# Patient Record
Sex: Female | Born: 1970 | Race: White | Hispanic: No | Marital: Married | Smoking: Never smoker
Health system: Southern US, Community
[De-identification: ages and names within clinical notes are randomized; demographics above are authoritative.]

## PROBLEM LIST (undated history)

## (undated) DIAGNOSIS — E039 Hypothyroidism, unspecified: Secondary | ICD-10-CM

## (undated) DIAGNOSIS — T4145XA Adverse effect of unspecified anesthetic, initial encounter: Secondary | ICD-10-CM

## (undated) DIAGNOSIS — Z9889 Other specified postprocedural states: Secondary | ICD-10-CM

## (undated) DIAGNOSIS — K635 Polyp of colon: Secondary | ICD-10-CM

## (undated) DIAGNOSIS — R112 Nausea with vomiting, unspecified: Secondary | ICD-10-CM

## (undated) DIAGNOSIS — D649 Anemia, unspecified: Secondary | ICD-10-CM

## (undated) DIAGNOSIS — J309 Allergic rhinitis, unspecified: Secondary | ICD-10-CM

## (undated) DIAGNOSIS — I499 Cardiac arrhythmia, unspecified: Secondary | ICD-10-CM

## (undated) DIAGNOSIS — C801 Malignant (primary) neoplasm, unspecified: Secondary | ICD-10-CM

## (undated) DIAGNOSIS — R519 Headache, unspecified: Secondary | ICD-10-CM

## (undated) HISTORY — PX: DILATION AND CURETTAGE OF UTERUS: SHX78

## (undated) HISTORY — PX: CHOLECYSTECTOMY: SHX55

## (undated) HISTORY — DX: Polyp of colon: K63.5

---
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
## 1996-02-27 HISTORY — PX: DEEP NECK LYMPH NODE BIOPSY / EXCISION: SUR126

## 1996-02-27 HISTORY — PX: LYMPH NODE BIOPSY: SHX201

## 2007-05-14 ENCOUNTER — Encounter: Payer: Self-pay | Admitting: Family Medicine

## 2007-10-22 ENCOUNTER — Encounter (INDEPENDENT_AMBULATORY_CARE_PROVIDER_SITE_OTHER): Payer: Self-pay | Admitting: *Deleted

## 2007-11-29 ENCOUNTER — Emergency Department (HOSPITAL_BASED_OUTPATIENT_CLINIC_OR_DEPARTMENT_OTHER): Admission: EM | Admit: 2007-11-29 | Discharge: 2007-11-29 | Payer: Self-pay | Admitting: Emergency Medicine

## 2007-12-08 ENCOUNTER — Other Ambulatory Visit: Admission: RE | Admit: 2007-12-08 | Discharge: 2007-12-08 | Payer: Self-pay | Admitting: Family Medicine

## 2007-12-08 ENCOUNTER — Ambulatory Visit: Payer: Self-pay | Admitting: Family Medicine

## 2007-12-08 ENCOUNTER — Encounter: Payer: Self-pay | Admitting: Family Medicine

## 2007-12-08 DIAGNOSIS — R002 Palpitations: Secondary | ICD-10-CM

## 2007-12-08 DIAGNOSIS — E049 Nontoxic goiter, unspecified: Secondary | ICD-10-CM | POA: Insufficient documentation

## 2007-12-08 DIAGNOSIS — E039 Hypothyroidism, unspecified: Secondary | ICD-10-CM | POA: Insufficient documentation

## 2007-12-11 ENCOUNTER — Encounter (INDEPENDENT_AMBULATORY_CARE_PROVIDER_SITE_OTHER): Payer: Self-pay | Admitting: *Deleted

## 2007-12-16 ENCOUNTER — Ambulatory Visit: Payer: Self-pay | Admitting: Family Medicine

## 2007-12-23 ENCOUNTER — Encounter (INDEPENDENT_AMBULATORY_CARE_PROVIDER_SITE_OTHER): Payer: Self-pay | Admitting: *Deleted

## 2007-12-23 LAB — CONVERTED CEMR LAB
ALT: 17 units/L (ref 0–35)
AST: 16 units/L (ref 0–37)
Albumin: 4.1 g/dL (ref 3.5–5.2)
Alkaline Phosphatase: 32 units/L — ABNORMAL LOW (ref 39–117)
BUN: 12 mg/dL (ref 6–23)
Basophils Absolute: 0 10*3/uL (ref 0.0–0.1)
Basophils Relative: 0 % (ref 0.0–3.0)
Bilirubin, Direct: 0.1 mg/dL (ref 0.0–0.3)
CO2: 30 meq/L (ref 19–32)
Calcium: 9 mg/dL (ref 8.4–10.5)
Chloride: 104 meq/L (ref 96–112)
Cholesterol: 141 mg/dL (ref 0–200)
Creatinine, Ser: 0.6 mg/dL (ref 0.4–1.2)
Eosinophils Absolute: 0.1 10*3/uL (ref 0.0–0.7)
Eosinophils Relative: 2.3 % (ref 0.0–5.0)
Free T4: 0.9 ng/dL (ref 0.6–1.6)
GFR calc Af Amer: 145 mL/min
GFR calc non Af Amer: 120 mL/min
Glucose, Bld: 82 mg/dL (ref 70–99)
HCT: 39.6 % (ref 36.0–46.0)
HDL: 52 mg/dL (ref >39.0)
Hemoglobin: 13.7 g/dL (ref 12.0–15.0)
LDL Cholesterol: 73 mg/dL (ref 0–99)
Lymphocytes Relative: 27.8 % (ref 12.0–46.0)
MCHC: 34.6 g/dL (ref 30.0–36.0)
MCV: 92.6 fL (ref 78.0–100.0)
Monocytes Absolute: 0.4 10*3/uL (ref 0.1–1.0)
Monocytes Relative: 7.7 % (ref 3.0–12.0)
Neutro Abs: 3.4 10*3/uL (ref 1.4–7.7)
Neutrophils Relative %: 62.2 % (ref 43.0–77.0)
Platelets: 193 10*3/uL (ref 150–400)
Potassium: 4.1 meq/L (ref 3.5–5.1)
RBC: 4.28 M/uL (ref 3.87–5.11)
RDW: 11.8 % (ref 11.5–14.6)
Sodium: 138 meq/L (ref 135–145)
T3, Free: 2.8 pg/mL (ref 2.3–4.2)
TSH: 1.06 microintl units/mL (ref 0.35–5.50)
Total Bilirubin: 1 mg/dL (ref 0.3–1.2)
Total CHOL/HDL Ratio: 2.7
Total Protein: 7.2 g/dL (ref 6.0–8.3)
Triglycerides: 82 mg/dL (ref 0–149)
VLDL: 16 mg/dL (ref 0–40)
WBC: 5.4 10*3/uL (ref 4.5–10.5)

## 2008-01-21 ENCOUNTER — Ambulatory Visit: Payer: Self-pay | Admitting: Family Medicine

## 2008-01-21 DIAGNOSIS — N898 Other specified noninflammatory disorders of vagina: Secondary | ICD-10-CM | POA: Insufficient documentation

## 2008-01-23 ENCOUNTER — Encounter (INDEPENDENT_AMBULATORY_CARE_PROVIDER_SITE_OTHER): Payer: Self-pay | Admitting: *Deleted

## 2008-02-27 DIAGNOSIS — T8859XA Other complications of anesthesia, initial encounter: Secondary | ICD-10-CM

## 2008-02-27 HISTORY — DX: Other complications of anesthesia, initial encounter: T88.59XA

## 2008-02-27 HISTORY — PX: CHOLECYSTECTOMY: SHX55

## 2008-06-08 ENCOUNTER — Encounter: Payer: Self-pay | Admitting: Family Medicine

## 2009-12-21 ENCOUNTER — Encounter: Admission: RE | Admit: 2009-12-21 | Discharge: 2009-12-21 | Payer: Self-pay | Admitting: Obstetrics

## 2010-11-28 LAB — BASIC METABOLIC PANEL
CO2: 29
Calcium: 9.3
Chloride: 103
Glucose, Bld: 98
Sodium: 140

## 2010-11-28 LAB — CBC
Hemoglobin: 13
MCHC: 33.8
MCV: 91.6
RBC: 4.19
RDW: 11.7

## 2010-11-28 LAB — DIFFERENTIAL
Basophils Absolute: 0.1
Basophils Relative: 1
Eosinophils Absolute: 0.2
Eosinophils Relative: 3
Monocytes Absolute: 0.6
Monocytes Relative: 8
Neutro Abs: 4

## 2011-01-10 ENCOUNTER — Other Ambulatory Visit: Payer: Self-pay | Admitting: Obstetrics

## 2011-01-10 DIAGNOSIS — Z1231 Encounter for screening mammogram for malignant neoplasm of breast: Secondary | ICD-10-CM

## 2011-02-16 ENCOUNTER — Ambulatory Visit
Admission: RE | Admit: 2011-02-16 | Discharge: 2011-02-16 | Disposition: A | Discharge status: Home / Self Care | Payer: Managed Care, Other (non HMO) | Source: Ambulatory Visit | Attending: Obstetrics | Admitting: Obstetrics

## 2011-02-16 DIAGNOSIS — Z1231 Encounter for screening mammogram for malignant neoplasm of breast: Secondary | ICD-10-CM

## 2012-01-28 ENCOUNTER — Other Ambulatory Visit: Payer: Self-pay | Admitting: Obstetrics

## 2012-01-28 DIAGNOSIS — Z1231 Encounter for screening mammogram for malignant neoplasm of breast: Secondary | ICD-10-CM

## 2012-02-25 ENCOUNTER — Ambulatory Visit
Admission: RE | Admit: 2012-02-25 | Discharge: 2012-02-25 | Disposition: A | Discharge status: Home / Self Care | Payer: Managed Care, Other (non HMO) | Source: Ambulatory Visit | Attending: Obstetrics | Admitting: Obstetrics

## 2012-02-25 DIAGNOSIS — Z1231 Encounter for screening mammogram for malignant neoplasm of breast: Secondary | ICD-10-CM

## 2012-03-03 ENCOUNTER — Other Ambulatory Visit: Payer: Self-pay | Admitting: Obstetrics

## 2012-03-03 DIAGNOSIS — R928 Other abnormal and inconclusive findings on diagnostic imaging of breast: Secondary | ICD-10-CM

## 2012-03-07 ENCOUNTER — Ambulatory Visit
Admission: RE | Admit: 2012-03-07 | Discharge: 2012-03-07 | Disposition: A | Discharge status: Home / Self Care | Payer: BC Managed Care – PPO | Source: Ambulatory Visit | Attending: Obstetrics | Admitting: Obstetrics

## 2012-03-07 DIAGNOSIS — R928 Other abnormal and inconclusive findings on diagnostic imaging of breast: Secondary | ICD-10-CM

## 2013-01-27 ENCOUNTER — Other Ambulatory Visit: Payer: Self-pay

## 2013-01-27 DIAGNOSIS — Z1231 Encounter for screening mammogram for malignant neoplasm of breast: Secondary | ICD-10-CM

## 2013-02-26 HISTORY — PX: COLONOSCOPY: SHX174

## 2013-03-04 ENCOUNTER — Ambulatory Visit
Admission: RE | Admit: 2013-03-04 | Discharge: 2013-03-04 | Disposition: A | Discharge status: Home / Self Care | Payer: BC Managed Care – HMO | Source: Ambulatory Visit

## 2013-03-04 DIAGNOSIS — Z1231 Encounter for screening mammogram for malignant neoplasm of breast: Secondary | ICD-10-CM

## 2013-07-03 ENCOUNTER — Other Ambulatory Visit: Payer: Self-pay | Admitting: Gastroenterology

## 2013-07-03 DIAGNOSIS — R1310 Dysphagia, unspecified: Secondary | ICD-10-CM

## 2013-07-06 ENCOUNTER — Encounter (HOSPITAL_COMMUNITY): Payer: Self-pay | Admitting: *Deleted

## 2013-07-06 ENCOUNTER — Encounter (HOSPITAL_COMMUNITY): Payer: Self-pay | Admitting: Pharmacy Technician

## 2013-07-09 ENCOUNTER — Ambulatory Visit
Admission: RE | Admit: 2013-07-09 | Discharge: 2013-07-09 | Disposition: A | Discharge status: Home / Self Care | Payer: BC Managed Care – HMO | Source: Ambulatory Visit | Attending: Gastroenterology | Admitting: Gastroenterology

## 2013-07-09 ENCOUNTER — Encounter (INDEPENDENT_AMBULATORY_CARE_PROVIDER_SITE_OTHER): Payer: Self-pay

## 2013-07-09 DIAGNOSIS — R1310 Dysphagia, unspecified: Secondary | ICD-10-CM

## 2013-07-13 ENCOUNTER — Other Ambulatory Visit: Payer: Self-pay | Admitting: Gastroenterology

## 2013-07-13 ENCOUNTER — Encounter (HOSPITAL_COMMUNITY): Payer: BC Managed Care – PPO | Admitting: Anesthesiology

## 2013-07-13 ENCOUNTER — Encounter (HOSPITAL_COMMUNITY)
Admission: RE | Disposition: A | Discharge status: Home / Self Care | Payer: Self-pay | Source: Ambulatory Visit | Attending: Gastroenterology

## 2013-07-13 ENCOUNTER — Ambulatory Visit (HOSPITAL_COMMUNITY)
Admission: RE | Admit: 2013-07-13 | Discharge: 2013-07-13 | Disposition: A | Discharge status: Home / Self Care | Payer: BC Managed Care – PPO | Source: Ambulatory Visit | Attending: Gastroenterology | Admitting: Gastroenterology

## 2013-07-13 ENCOUNTER — Ambulatory Visit (HOSPITAL_COMMUNITY): Payer: BC Managed Care – PPO | Admitting: Anesthesiology

## 2013-07-13 ENCOUNTER — Encounter (HOSPITAL_COMMUNITY): Payer: Self-pay

## 2013-07-13 DIAGNOSIS — E039 Hypothyroidism, unspecified: Secondary | ICD-10-CM | POA: Insufficient documentation

## 2013-07-13 DIAGNOSIS — R079 Chest pain, unspecified: Secondary | ICD-10-CM | POA: Insufficient documentation

## 2013-07-13 DIAGNOSIS — R1310 Dysphagia, unspecified: Secondary | ICD-10-CM | POA: Insufficient documentation

## 2013-07-13 HISTORY — DX: Adverse effect of unspecified anesthetic, initial encounter: T41.45XA

## 2013-07-13 HISTORY — DX: Hypothyroidism, unspecified: E03.9

## 2013-07-13 HISTORY — PX: ESOPHAGEAL MANOMETRY: SHX5429

## 2013-07-13 HISTORY — DX: Cardiac arrhythmia, unspecified: I49.9

## 2013-07-13 SURGERY — EGD (ESOPHAGOGASTRODUODENOSCOPY)
Anesthesia: Monitor Anesthesia Care

## 2013-07-13 SURGERY — MANOMETRY, ESOPHAGUS

## 2013-07-13 MED ORDER — PROPOFOL 10 MG/ML IV BOLUS
INTRAVENOUS | Status: AC
  Filled 2013-07-13: qty 20

## 2013-07-13 MED ORDER — SODIUM CHLORIDE 0.9 % IV SOLN: INTRAVENOUS | Status: DC

## 2013-07-13 MED ORDER — LIDOCAINE VISCOUS 2 % MT SOLN
OROMUCOSAL | Status: AC
  Filled 2013-07-13: qty 15

## 2013-07-13 MED ORDER — LACTATED RINGERS IV SOLN
INTRAVENOUS | Status: DC
  Administered 2013-07-13: 11:00:00 via INTRAVENOUS

## 2013-07-13 SURGICAL SUPPLY — 1 items: FACESHIELD LNG OPTICON STERILE (SAFETY) IMPLANT

## 2013-07-13 NOTE — Addendum Note (Signed)
Addended by: Danne Scardina on: 07/13/2013 09:02 AM   Modules accepted: Orders  

## 2013-07-13 NOTE — Anesthesia Preprocedure Evaluation (Deleted)
Anesthesia Evaluation  Patient identified by MRN, date of birth, ID band Patient awake    Reviewed: Allergy & Precautions, H&P , NPO status , Patient's Chart, lab work & pertinent test results  Airway Mallampati: II TM Distance: >3 FB Neck ROM: Full    Dental no notable dental hx.    Pulmonary neg pulmonary ROS,  breath sounds clear to auscultation  Pulmonary exam normal       Cardiovascular negative cardio ROS  Rhythm:Regular Rate:Normal     Neuro/Psych negative neurological ROS  negative psych ROS   GI/Hepatic negative GI ROS, Neg liver ROS,   Endo/Other  Hypothyroidism   Renal/GU negative Renal ROS  negative genitourinary   Musculoskeletal negative musculoskeletal ROS (+)   Abdominal   Peds negative pediatric ROS (+)  Hematology negative hematology ROS (+)   Anesthesia Other Findings   Reproductive/Obstetrics negative OB ROS                           Anesthesia Physical Anesthesia Plan  ASA: II  Anesthesia Plan: MAC   Post-op Pain Management:    Induction: Intravenous  Airway Management Planned: Nasal Cannula  Additional Equipment:   Intra-op Plan:   Post-operative Plan:   Informed Consent: I have reviewed the patients History and Physical, chart, labs and discussed the procedure including the risks, benefits and alternatives for the proposed anesthesia with the patient or authorized representative who has indicated his/her understanding and acceptance.     Plan Discussed with: CRNA and Surgeon  Anesthesia Plan Comments:         Anesthesia Quick Evaluation

## 2013-07-13 NOTE — OR Nursing (Addendum)
Bravo calibration solution expired.  Notified MD and explained to patient.  MD office will contact patient to reschedule. Egd and Bravo cancelled for 5/1//15.

## 2013-07-14 ENCOUNTER — Encounter (HOSPITAL_COMMUNITY): Payer: Self-pay | Admitting: Gastroenterology

## 2013-07-16 ENCOUNTER — Encounter (HOSPITAL_COMMUNITY): Payer: Self-pay | Admitting: *Deleted

## 2013-07-27 ENCOUNTER — Other Ambulatory Visit: Payer: Self-pay | Admitting: Gastroenterology

## 2013-07-27 NOTE — Addendum Note (Signed)
Addended by: Arta Silence on: 07/27/2013 02:29 PM   Modules accepted: Orders

## 2013-07-29 ENCOUNTER — Encounter (HOSPITAL_COMMUNITY): Payer: BC Managed Care – PPO | Admitting: Anesthesiology

## 2013-07-29 ENCOUNTER — Encounter (HOSPITAL_COMMUNITY)
Admission: RE | Disposition: A | Discharge status: Home / Self Care | Payer: Self-pay | Source: Ambulatory Visit | Attending: Gastroenterology

## 2013-07-29 ENCOUNTER — Ambulatory Visit (HOSPITAL_COMMUNITY): Payer: BC Managed Care – PPO | Admitting: Anesthesiology

## 2013-07-29 ENCOUNTER — Encounter (HOSPITAL_COMMUNITY): Payer: Self-pay

## 2013-07-29 ENCOUNTER — Ambulatory Visit (HOSPITAL_COMMUNITY)
Admission: RE | Admit: 2013-07-29 | Discharge: 2013-07-29 | Disposition: A | Discharge status: Home / Self Care | Payer: BC Managed Care – PPO | Source: Ambulatory Visit | Attending: Gastroenterology | Admitting: Gastroenterology

## 2013-07-29 DIAGNOSIS — E039 Hypothyroidism, unspecified: Secondary | ICD-10-CM | POA: Insufficient documentation

## 2013-07-29 DIAGNOSIS — R1310 Dysphagia, unspecified: Secondary | ICD-10-CM | POA: Insufficient documentation

## 2013-07-29 DIAGNOSIS — R079 Chest pain, unspecified: Secondary | ICD-10-CM | POA: Insufficient documentation

## 2013-07-29 HISTORY — PX: BRAVO PH STUDY: SHX5421

## 2013-07-29 HISTORY — PX: ESOPHAGOGASTRODUODENOSCOPY (EGD) WITH PROPOFOL: SHX5813

## 2013-07-29 SURGERY — ESOPHAGOGASTRODUODENOSCOPY (EGD) WITH PROPOFOL
Anesthesia: Monitor Anesthesia Care

## 2013-07-29 MED ORDER — LACTATED RINGERS IV SOLN
INTRAVENOUS | Status: DC | PRN
  Administered 2013-07-29: 09:00:00 via INTRAVENOUS

## 2013-07-29 MED ORDER — EPHEDRINE SULFATE 50 MG/ML IJ SOLN
INTRAMUSCULAR | Status: AC
  Filled 2013-07-29: qty 1

## 2013-07-29 MED ORDER — ONDANSETRON HCL 4 MG/2ML IJ SOLN
INTRAMUSCULAR | Status: AC
  Filled 2013-07-29: qty 2

## 2013-07-29 MED ORDER — KETAMINE HCL 10 MG/ML IJ SOLN
INTRAMUSCULAR | Status: AC
  Filled 2013-07-29: qty 1

## 2013-07-29 MED ORDER — PROPOFOL 10 MG/ML IV BOLUS
INTRAVENOUS | Status: AC
  Filled 2013-07-29: qty 20

## 2013-07-29 MED ORDER — LACTATED RINGERS IV SOLN
INTRAVENOUS | Status: DC
Start: 2013-07-29 — End: 2013-07-29
  Administered 2013-07-29: 09:00:00 via INTRAVENOUS

## 2013-07-29 MED ORDER — LIDOCAINE HCL (CARDIAC) 20 MG/ML IV SOLN
INTRAVENOUS | Status: AC
  Filled 2013-07-29: qty 5

## 2013-07-29 MED ORDER — SODIUM CHLORIDE 0.9 % IV SOLN: INTRAVENOUS | Status: DC

## 2013-07-29 MED ORDER — PROPOFOL INFUSION 10 MG/ML OPTIME
INTRAVENOUS | Status: DC | PRN
  Administered 2013-07-29: 140 ug/kg/min via INTRAVENOUS

## 2013-07-29 MED ORDER — SODIUM CHLORIDE 0.9 % IJ SOLN
INTRAMUSCULAR | Status: AC
  Filled 2013-07-29: qty 10

## 2013-07-29 MED ORDER — PROPOFOL 10 MG/ML IV EMUL
INTRAVENOUS | Status: DC | PRN
  Administered 2013-07-29: 40 mg via INTRAVENOUS

## 2013-07-29 MED ORDER — ONDANSETRON HCL 4 MG/2ML IJ SOLN
INTRAMUSCULAR | Status: DC | PRN
  Administered 2013-07-29: 4 mg via INTRAVENOUS

## 2013-07-29 MED ORDER — BUTAMBEN-TETRACAINE-BENZOCAINE 2-2-14 % EX AERO
INHALATION_SPRAY | CUTANEOUS | Status: DC | PRN
  Administered 2013-07-29: 1 via TOPICAL

## 2013-07-29 MED ORDER — KETAMINE HCL 10 MG/ML IJ SOLN
INTRAMUSCULAR | Status: DC | PRN
  Administered 2013-07-29: 20 mg via INTRAVENOUS

## 2013-07-29 SURGICAL SUPPLY — 14 items

## 2013-07-29 NOTE — Anesthesia Postprocedure Evaluation (Signed)
Anesthesia Post Note  Patient: Dana Joseph  Procedure(s) Performed: Procedure(s) (LRB): ESOPHAGOGASTRODUODENOSCOPY (EGD) WITH PROPOFOL (N/A) BRAVO PH STUDY (N/A)  Anesthesia type: MAC  Patient location: PACU  Post pain: Pain level controlled  Post assessment: Post-op Vital signs reviewed  Last Vitals: BP 104/62  Pulse 73  Temp(Src) 36.9 C (Oral)  Resp 21  SpO2 98%  LMP 07/15/2013  Post vital signs: Reviewed  Level of consciousness: awake  Complications: No apparent anesthesia complications

## 2013-07-29 NOTE — H&P (Signed)
Patient interval history reviewed.  Patient examined again.  There has been no change from documented H/P dated 07/03/13 (scanned into chart from our office) except as documented above.  Assessment:  1.  Chest pain. 2.  Dysphagia.  Plan:  1.  Endoscopy with possible esophageal dilatation; endoscopy with Bravo capsule placement. 2.  Risks (bleeding, infection, bowel perforation that could require surgery, sedation-related changes in cardiopulmonary systems), benefits (identification and possible treatment of source of symptoms, exclusion of certain causes of symptoms), and alternatives (watchful waiting, radiographic imaging studies, empiric medical treatment) of upper endoscopy with possible dilatation, with Bravo capsule placement (EGD +/- dil; with Bravo) were explained to patient/family in detail and patient wishes to proceed.

## 2013-07-29 NOTE — Discharge Instructions (Signed)
Esophagogastroduodenoscopy Esophagogastroduodenoscopy (EGD) is a procedure to examine the lining of the esophagus, stomach, and first part of the small intestine (duodenum). A long, flexible, lighted tube with a camera attached (endoscope) is inserted down the throat to view these organs. This procedure is done to detect problems or abnormalities, such as inflammation, bleeding, ulcers, or growths, in order to treat them. The procedure lasts about 5 20 minutes. It is usually an outpatient procedure, but it may need to be performed in emergency cases in the hospital. LET YOUR CAREGIVER KNOW ABOUT:   Allergies to food or medicine.  All medicines you are taking, including vitamins, herbs, eyedrops, and over-the-counter medicines and creams.  Use of steroids (by mouth or creams).  Previous problems you or members of your family have had with the use of anesthetics.  Any blood disorders you have.  Previous surgeries you have had.  Other health problems you have.  Possibility of pregnancy, if this applies. RISKS AND COMPLICATIONS  Generally, EGD is a safe procedure. However, as with any procedure, complications can occur. Possible complications include:  Infection.  Bleeding.  Tearing (perforation) of the esophagus, stomach, or duodenum.  Difficulty breathing or not being able to breath.  Excessive sweating.  Spasms of the larynx.  Slowed heartbeat.  Low blood pressure. BEFORE THE PROCEDURE  Do not eat or drink anything for 6 8 hours before the procedure or as directed by your caregiver.  Ask your caregiver about changing or stopping your regular medicines.  If you wear dentures, be prepared to remove them before the procedure.  Arrange for someone to drive you home after the procedure. PROCEDURE   A vein will be accessed to give medicines and fluids. A medicine to relax you (sedative) and a pain reliever will be given through that access into the vein.  A numbing medicine  (local anesthetic) may be sprayed on your throat for comfort and to stop you from gagging or coughing.  A mouth guard may be placed in your mouth to protect your teeth and to keep you from biting on the endoscope.  You will be asked to lie on your left side.  The endoscope is inserted down your throat and into the esophagus, stomach, and duodenum.  Air is put through the endoscope to allow your caregiver to view the lining of your esophagus clearly.  The esophagus, stomach, and duodenum is then examined. During the exam, your caregiver may:  Remove tissue to be examined under a microscope (biopsy) for inflammation, infection, or other medical problems.  Remove growths.  Remove objects (foreign bodies) that are stuck.  Treat any bleeding with medicines or other devices that stop tissues from bleeding (hot cauters, clipping devices).  Widen (dilate) or stretch narrowed areas of the esophagus and stomach.  The endoscope will then be withdrawn. AFTER THE PROCEDURE  You will be taken to a recovery area to be monitored. You will be able to go home once you are stable and alert.  Do not eat or drink anything until the local anesthetic and numbing medicines have worn off. You may choke.  It is normal to feel bloated, have pain with swallowing, or have a sore throat for a short time. This will wear off.  Your caregiver should be able to discuss his or her findings with you. It will take longer to discuss the test results if any biopsies were taken. Document Released: 06/15/2004 Document Revised: 01/30/2012 Document Reviewed: 01/16/2012 [facility] Patient Information 2014 [place], [place]. Monitored Anesthesia  Care  Monitored anesthesia care is an anesthesia service for a medical procedure. Anesthesia is the loss of the ability to feel pain. It is produced by medications called anesthetics. It may affect a small area of your body (local anesthesia), a large area of your body (regional  anesthesia), or your entire body (general anesthesia). The need for monitored anesthesia care depends your procedure, your condition, and the potential need for regional or general anesthesia. It is often provided during procedures where:   General anesthesia may be needed if there are complications. This is because you need special care when you are under general anesthesia.   You will be under local or regional anesthesia. This is so that you are able to have higher levels of anesthesia if needed.   You will receive calming medications (sedatives). This is especially the case if sedatives are given to put you in a semi-conscious state of relaxation (deep sedation). This is because the amount of sedative needed to produce this state can be hard to predict. Too much of a sedative can produce general anesthesia. Monitored anesthesia care is performed by one or more caregivers who have special training in all types of anesthesia. You will need to meet with these caregivers before your procedure. During this meeting, they will ask you about your medical history. They will also give you instructions to follow. (For example, you will need to stop eating and drinking before your procedure. You may also need to stop or change medications you are taking.) During your procedure, your caregivers will stay with you. They will:   Watch your condition. This includes watching you blood pressure, breathing, and level of pain.   Diagnose and treat problems that occur.   Give medications if they are needed. These may include calming medications (sedatives) and anesthetics.   Make sure you are comfortable.  Having monitored anesthesia care does not necessarily mean that you will be under anesthesia. It does mean that your caregivers will be able to manage anesthesia if you need it or if it occurs. It also means that you will be able to have a different type of anesthesia than you are having if you need it. When  your procedure is complete, your caregivers will continue to watch your condition. They will make sure any medications wear off before you are allowed to go home.  Document Released: 11/08/2004 Document Revised: 06/09/2012 Document Reviewed: 03/26/2012 [facility] Patient Information 2014 [place], [place].

## 2013-07-29 NOTE — Anesthesia Preprocedure Evaluation (Addendum)
Anesthesia Evaluation  Patient identified by MRN, date of birth, ID band Patient awake    Reviewed: Allergy & Precautions, H&P , NPO status , Patient's Chart, lab work & pertinent test results  History of Anesthesia Complications (+) PONV and history of anesthetic complications  Airway Mallampati: II TM Distance: >3 FB Neck ROM: Full    Dental no notable dental hx.    Pulmonary neg pulmonary ROS,  breath sounds clear to auscultation  Pulmonary exam normal       Cardiovascular negative cardio ROS  Rhythm:Regular Rate:Normal     Neuro/Psych negative neurological ROS  negative psych ROS   GI/Hepatic negative GI ROS, Neg liver ROS,   Endo/Other  Hypothyroidism   Renal/GU negative Renal ROS  negative genitourinary   Musculoskeletal negative musculoskeletal ROS (+)   Abdominal   Peds negative pediatric ROS (+)  Hematology negative hematology ROS (+)   Anesthesia Other Findings   Reproductive/Obstetrics negative OB ROS                           Anesthesia Physical  Anesthesia Plan  ASA: II  Anesthesia Plan: MAC   Post-op Pain Management:    Induction: Intravenous  Airway Management Planned: Nasal Cannula  Additional Equipment:   Intra-op Plan:   Post-operative Plan:   Informed Consent: I have reviewed the patients History and Physical, chart, labs and discussed the procedure including the risks, benefits and alternatives for the proposed anesthesia with the patient or authorized representative who has indicated his/her understanding and acceptance.     Plan Discussed with: CRNA and Surgeon  Anesthesia Plan Comments:         Anesthesia Quick Evaluation

## 2013-07-29 NOTE — Op Note (Signed)
[facility] [place], [address]   ENDOSCOPY PROCEDURE REPORT  PATIENT: Dana, Joseph  MR#: [id number] BIRTHDATE: November 23, 1970 , 42  yrs. old GENDER: Female ENDOSCOPIST: Arta Silence, MD REFERRED BY:  Horald Pollen, M.D. PROCEDURE DATE:  07/29/2013 PROCEDURE:  EGD w/ Bravo capsule placement  (OFF PPI) ASA CLASS:     Class II INDICATIONS:  chest pain, dysphagia. MEDICATIONS: MAC sedation, administered by CRNA TOPICAL ANESTHETIC:  DESCRIPTION OF PROCEDURE: After the risks benefits and alternatives of the procedure were thoroughly explained, informed consent was obtained.  The Pentax Gastroscope [id number] endoscope was introduced through the mouth and advanced to the second portion of the duodenum. Without limitations.  The instrument was slowly withdrawn as the mucosa was fully examined.     Findings:  Normal-appearing esophagus; specifically, no evidence of stricture, mass, varices, hiatal hernia, or esophagitis.  No mucosal features to suggest eosinophilic esophagitis.  Normal stomach and pylorus.  Normal duodenum to the second portion.    Bravo capsule was placed 6 cm proximal to GE junction (GEJ 40cm, capsule placement 34cm) employing standard protocol; second-look endoscopy showed appropriate positioning of the capsule.          The scope was then withdrawn from the patient and the procedure completed.  ENDOSCOPIC IMPRESSION:     As above.  Normal endoscopy. Successful placement of Bravo capsule.  RECOMMENDATIONS:     1.  Watch for potential complications of procedure. 2.  Await Bravo capsule findings. 3.  Await esophageal manometry findings. 4.  Office visit in 2-3 weeks, to review all above studies, and decide on next step in management.  eSigned:  Arta Silence, MD 07/29/2013 10:25 AM   CC:

## 2013-07-29 NOTE — Transfer of Care (Signed)
Immediate Anesthesia Transfer of Care Note  Patient: Dana Joseph  Procedure(s) Performed: Procedure(s): ESOPHAGOGASTRODUODENOSCOPY (EGD) WITH PROPOFOL (N/A) BRAVO PH STUDY (N/A)  Patient Location: PACU  Anesthesia Type:MAC  Level of Consciousness: awake, alert  and oriented  Airway & Oxygen Therapy: Patient Spontanous Breathing and Patient connected to nasal cannula oxygen  Post-op Assessment: Report given to PACU RN and Post -op Vital signs reviewed and stable  Post vital signs: Reviewed and stable  Complications: No apparent anesthesia complications

## 2013-07-30 ENCOUNTER — Encounter (HOSPITAL_COMMUNITY): Payer: Self-pay | Admitting: Gastroenterology

## 2014-02-17 ENCOUNTER — Other Ambulatory Visit: Payer: Self-pay

## 2014-02-17 DIAGNOSIS — Z1231 Encounter for screening mammogram for malignant neoplasm of breast: Secondary | ICD-10-CM

## 2014-02-26 HISTORY — PX: THYROIDECTOMY: SHX17

## 2014-03-10 ENCOUNTER — Other Ambulatory Visit: Payer: Self-pay

## 2014-03-10 ENCOUNTER — Ambulatory Visit
Admission: RE | Admit: 2014-03-10 | Discharge: 2014-03-10 | Disposition: A | Discharge status: Home / Self Care | Payer: BLUE CROSS/BLUE SHIELD | Source: Ambulatory Visit

## 2014-03-10 DIAGNOSIS — Z1231 Encounter for screening mammogram for malignant neoplasm of breast: Secondary | ICD-10-CM

## 2014-03-11 ENCOUNTER — Encounter (HOSPITAL_COMMUNITY): Payer: Self-pay | Admitting: Gastroenterology

## 2014-05-11 ENCOUNTER — Other Ambulatory Visit: Payer: Self-pay | Admitting: Endocrinology

## 2014-05-11 DIAGNOSIS — E049 Nontoxic goiter, unspecified: Secondary | ICD-10-CM

## 2014-05-24 ENCOUNTER — Ambulatory Visit
Admission: RE | Admit: 2014-05-24 | Discharge: 2014-05-24 | Disposition: A | Discharge status: Home / Self Care | Payer: BLUE CROSS/BLUE SHIELD | Source: Ambulatory Visit | Attending: Endocrinology | Admitting: Endocrinology

## 2014-05-24 DIAGNOSIS — E049 Nontoxic goiter, unspecified: Secondary | ICD-10-CM

## 2014-05-31 ENCOUNTER — Other Ambulatory Visit: Payer: Self-pay | Admitting: Endocrinology

## 2014-05-31 DIAGNOSIS — E041 Nontoxic single thyroid nodule: Secondary | ICD-10-CM

## 2014-06-09 ENCOUNTER — Other Ambulatory Visit (HOSPITAL_COMMUNITY)
Admission: RE | Admit: 2014-06-09 | Discharge: 2014-06-09 | Disposition: A | Discharge status: Home / Self Care | Payer: BLUE CROSS/BLUE SHIELD | Source: Ambulatory Visit | Attending: Interventional Radiology | Admitting: Interventional Radiology

## 2014-06-09 ENCOUNTER — Ambulatory Visit
Admission: RE | Admit: 2014-06-09 | Discharge: 2014-06-09 | Disposition: A | Discharge status: Home / Self Care | Payer: BLUE CROSS/BLUE SHIELD | Source: Ambulatory Visit | Attending: Endocrinology | Admitting: Endocrinology

## 2014-06-09 DIAGNOSIS — E041 Nontoxic single thyroid nodule: Secondary | ICD-10-CM

## 2014-06-15 ENCOUNTER — Ambulatory Visit: Payer: Self-pay | Admitting: Surgery

## 2014-06-27 HISTORY — PX: THYROIDECTOMY: SHX17

## 2015-03-02 ENCOUNTER — Other Ambulatory Visit: Payer: Self-pay

## 2015-03-02 DIAGNOSIS — Z1231 Encounter for screening mammogram for malignant neoplasm of breast: Secondary | ICD-10-CM

## 2015-03-29 ENCOUNTER — Ambulatory Visit
Admission: RE | Admit: 2015-03-29 | Discharge: 2015-03-29 | Disposition: A | Discharge status: Home / Self Care | Payer: BLUE CROSS/BLUE SHIELD | Source: Ambulatory Visit

## 2015-03-29 DIAGNOSIS — Z1231 Encounter for screening mammogram for malignant neoplasm of breast: Secondary | ICD-10-CM

## 2015-04-01 ENCOUNTER — Other Ambulatory Visit: Payer: Self-pay | Admitting: Obstetrics

## 2015-04-01 DIAGNOSIS — R928 Other abnormal and inconclusive findings on diagnostic imaging of breast: Secondary | ICD-10-CM

## 2015-04-05 ENCOUNTER — Ambulatory Visit
Admission: RE | Admit: 2015-04-05 | Discharge: 2015-04-05 | Disposition: A | Discharge status: Home / Self Care | Payer: BLUE CROSS/BLUE SHIELD | Source: Ambulatory Visit | Attending: Obstetrics | Admitting: Obstetrics

## 2015-04-05 DIAGNOSIS — R928 Other abnormal and inconclusive findings on diagnostic imaging of breast: Secondary | ICD-10-CM

## 2015-11-08 IMAGING — MG MM SCREENING BREAST TOMO BILATERAL
8 series · 9 of 24 positions shown · non-contrast
Comparison: Previous exam(s).

CLINICAL DATA: Screening.

EXAM:
DIGITAL SCREENING BILATERAL MAMMOGRAM WITH 3D TOMO WITH CAD

[R MLO]
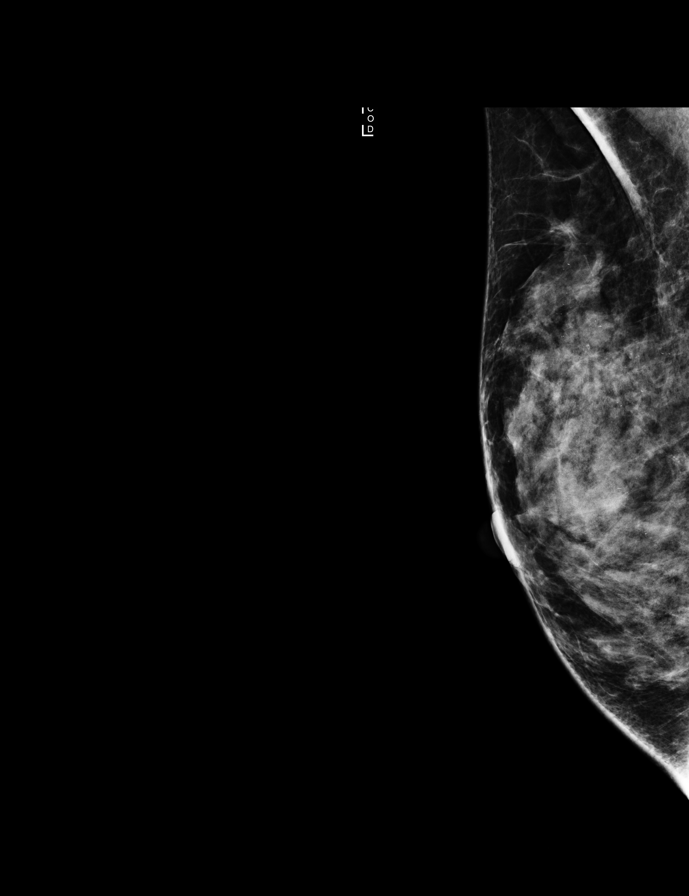

[L MLO]
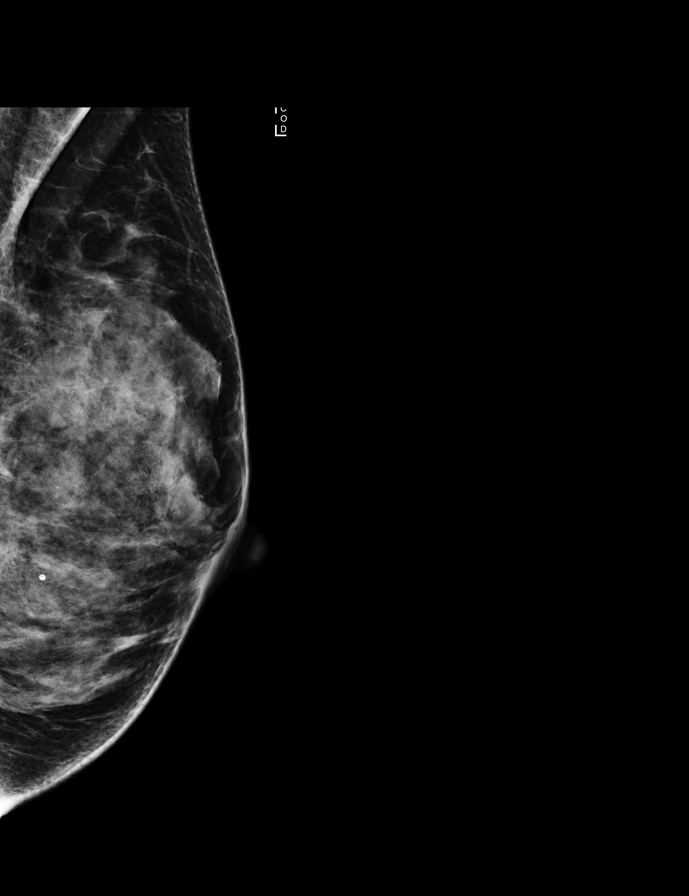

[R CC]
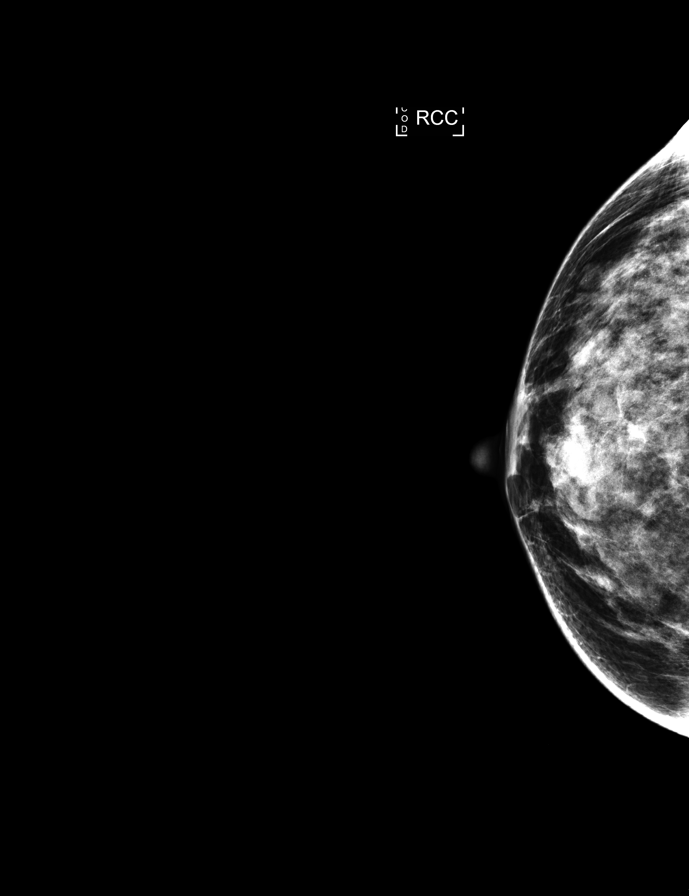

[L CC]
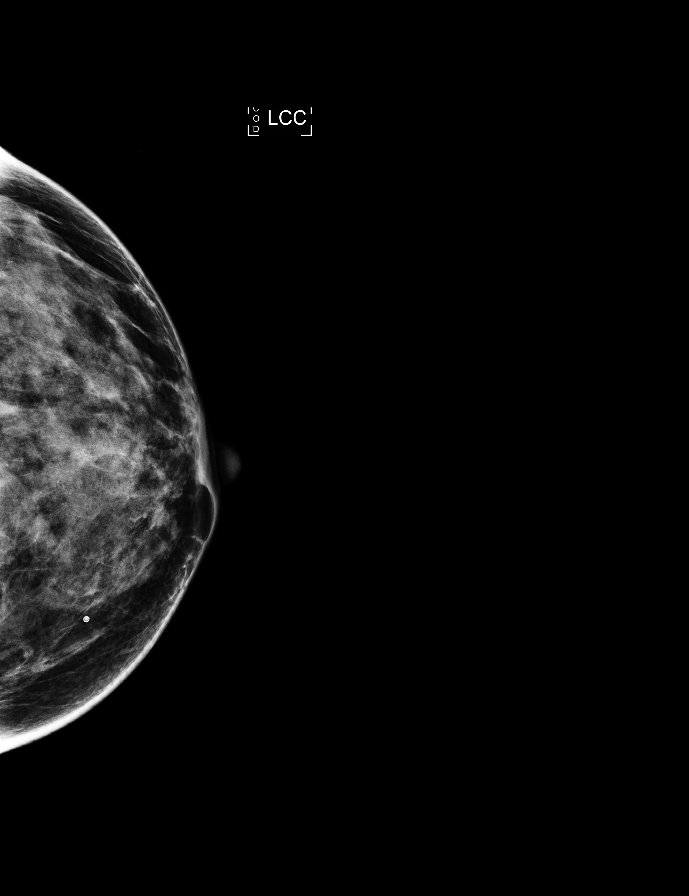

[R MLO tomo · 2 of 61 frames shown]
[frame 20/61]
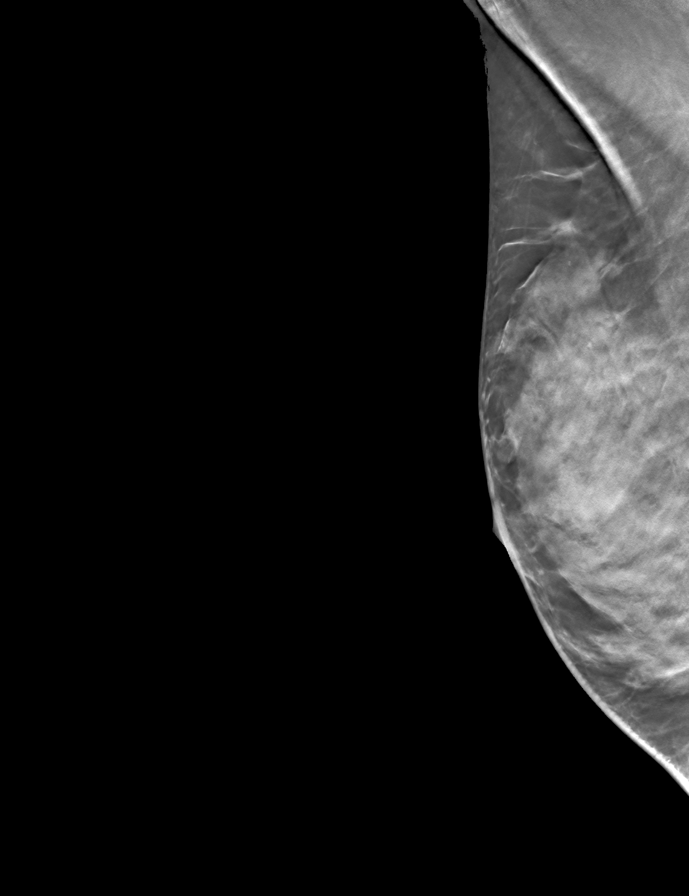
[frame 31/61]
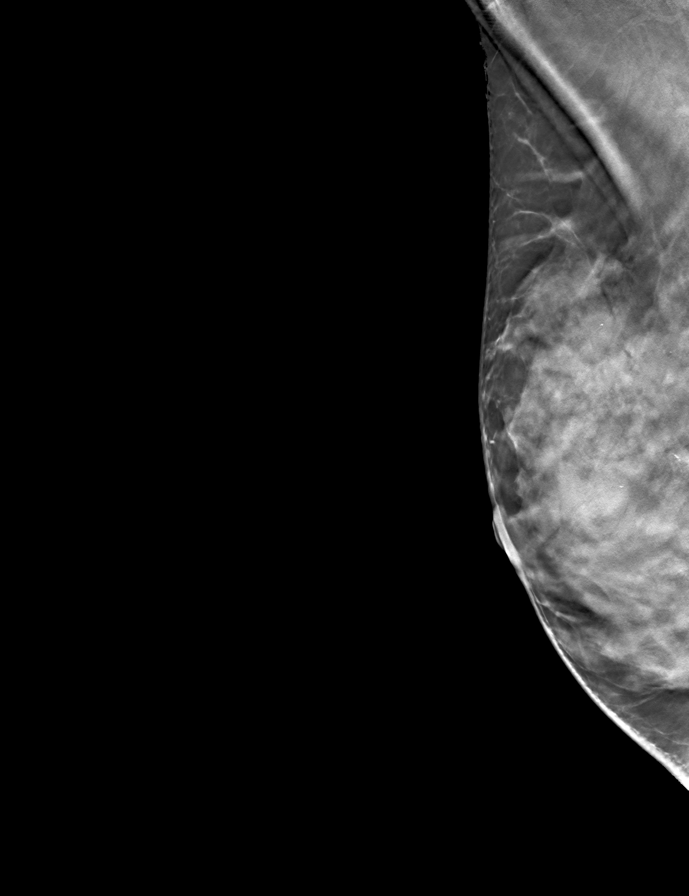

[L CC tomo · tomo slice 33/64.0]
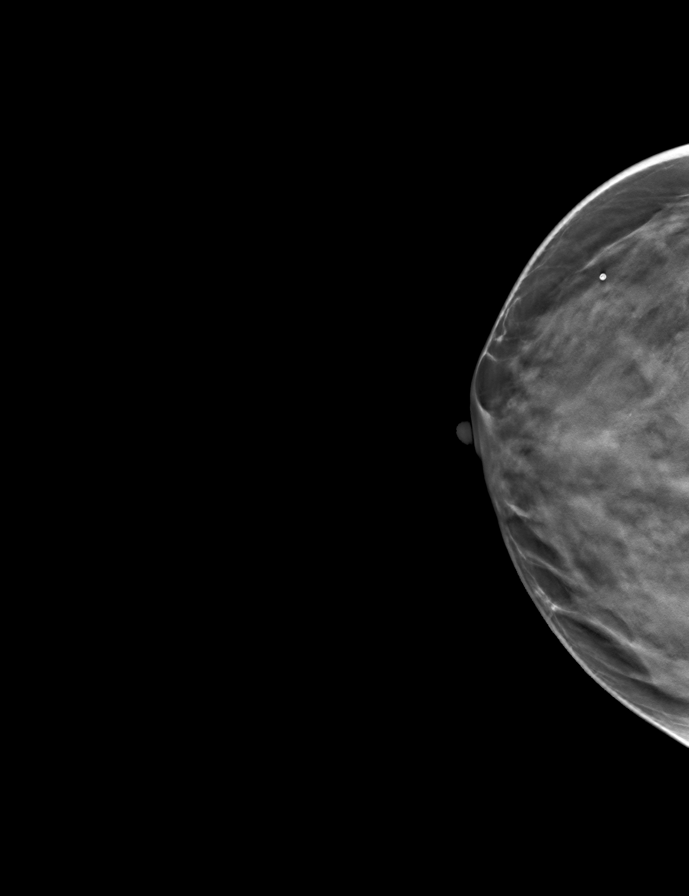

[L MLO tomo · tomo slice 31/60.0]
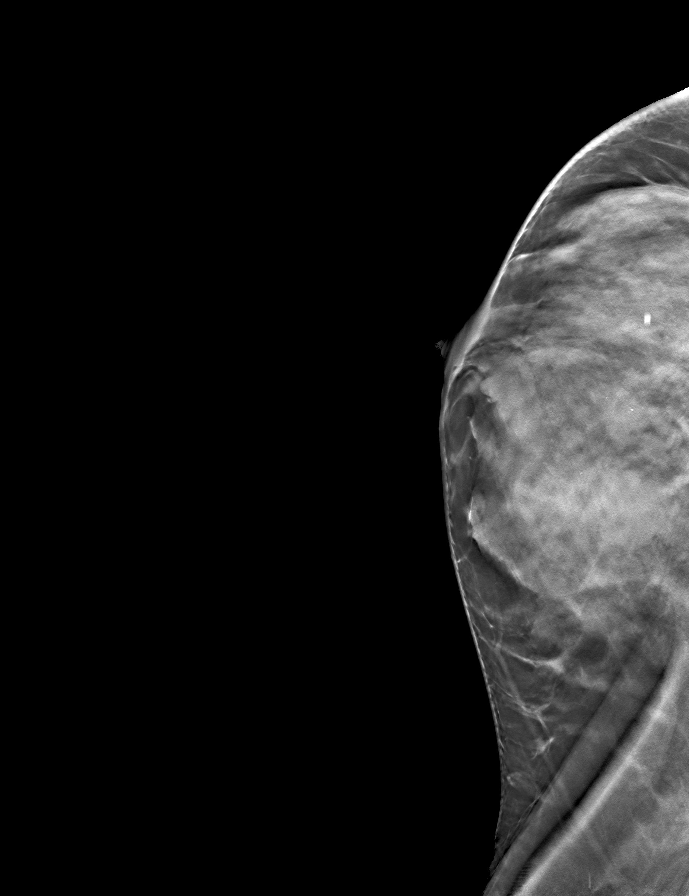

[R CC tomo · tomo slice 32/63.0]
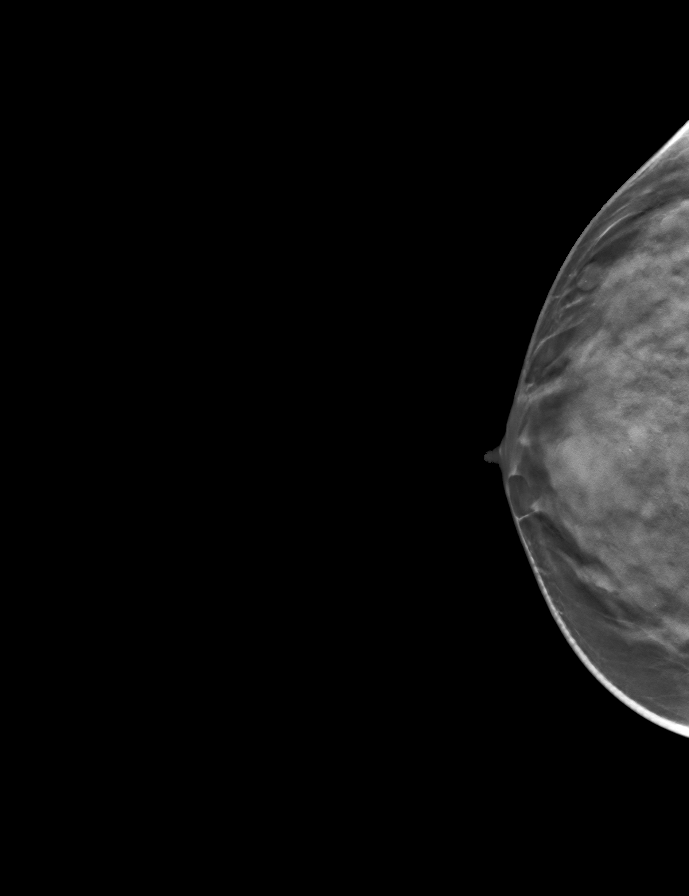

[9 of 24 positions shown; findings below may reference images not displayed]

ACR Breast Density Category c: The breast tissue is heterogeneously
dense, which may obscure small masses.
FINDINGS: There are no findings suspicious for malignancy. Images were
processed with CAD.
IMPRESSION: No mammographic evidence of malignancy. A result letter of this
screening mammogram will be mailed directly to the patient.

RECOMMENDATION:
Screening mammogram in one year. (Code:OA-G-1SS)

BI-RADS CATEGORY  1: Negative.

## 2016-03-05 ENCOUNTER — Other Ambulatory Visit: Payer: Self-pay | Admitting: Obstetrics

## 2016-03-05 DIAGNOSIS — Z1231 Encounter for screening mammogram for malignant neoplasm of breast: Secondary | ICD-10-CM

## 2016-04-16 ENCOUNTER — Ambulatory Visit
Admission: RE | Admit: 2016-04-16 | Discharge: 2016-04-16 | Disposition: A | Discharge status: Home / Self Care | Payer: BLUE CROSS/BLUE SHIELD | Source: Ambulatory Visit | Attending: Obstetrics | Admitting: Obstetrics

## 2016-04-16 DIAGNOSIS — Z1231 Encounter for screening mammogram for malignant neoplasm of breast: Secondary | ICD-10-CM

## 2017-03-07 ENCOUNTER — Other Ambulatory Visit: Payer: Self-pay

## 2017-03-07 ENCOUNTER — Encounter (HOSPITAL_COMMUNITY): Payer: Self-pay | Admitting: *Deleted

## 2017-03-11 ENCOUNTER — Other Ambulatory Visit: Payer: Self-pay | Admitting: Obstetrics

## 2017-03-21 ENCOUNTER — Ambulatory Visit (HOSPITAL_COMMUNITY)
Admission: RE | Admit: 2017-03-21 | Discharge: 2017-03-21 | Disposition: A | Discharge status: Home / Self Care | Payer: BLUE CROSS/BLUE SHIELD | Source: Ambulatory Visit | Attending: Obstetrics | Admitting: Obstetrics

## 2017-03-21 ENCOUNTER — Other Ambulatory Visit: Payer: Self-pay

## 2017-03-21 ENCOUNTER — Encounter (HOSPITAL_COMMUNITY)
Admission: RE | Disposition: A | Discharge status: Home / Self Care | Payer: Self-pay | Source: Ambulatory Visit | Attending: Obstetrics

## 2017-03-21 ENCOUNTER — Ambulatory Visit (HOSPITAL_COMMUNITY): Payer: BLUE CROSS/BLUE SHIELD | Admitting: Anesthesiology

## 2017-03-21 ENCOUNTER — Encounter (HOSPITAL_COMMUNITY): Payer: Self-pay

## 2017-03-21 DIAGNOSIS — N920 Excessive and frequent menstruation with regular cycle: Secondary | ICD-10-CM | POA: Insufficient documentation

## 2017-03-21 DIAGNOSIS — Z9049 Acquired absence of other specified parts of digestive tract: Secondary | ICD-10-CM | POA: Insufficient documentation

## 2017-03-21 DIAGNOSIS — I499 Cardiac arrhythmia, unspecified: Secondary | ICD-10-CM | POA: Insufficient documentation

## 2017-03-21 DIAGNOSIS — D259 Leiomyoma of uterus, unspecified: Secondary | ICD-10-CM | POA: Diagnosis present

## 2017-03-21 DIAGNOSIS — D649 Anemia, unspecified: Secondary | ICD-10-CM | POA: Insufficient documentation

## 2017-03-21 DIAGNOSIS — E890 Postprocedural hypothyroidism: Secondary | ICD-10-CM | POA: Insufficient documentation

## 2017-03-21 DIAGNOSIS — Z85850 Personal history of malignant neoplasm of thyroid: Secondary | ICD-10-CM | POA: Insufficient documentation

## 2017-03-21 HISTORY — PX: DILATATION & CURETTAGE/HYSTEROSCOPY WITH MYOSURE: SHX6511

## 2017-03-21 HISTORY — DX: Anemia, unspecified: D64.9

## 2017-03-21 HISTORY — DX: Malignant (primary) neoplasm, unspecified: C80.1

## 2017-03-21 LAB — CBC
HCT: 37.7 % (ref 36.0–46.0)
Hemoglobin: 12.6 g/dL (ref 12.0–15.0)
MCH: 30.3 pg (ref 26.0–34.0)
MCHC: 33.4 g/dL (ref 30.0–36.0)
MCV: 90.6 fL (ref 78.0–100.0)
PLATELETS: 227 10*3/uL (ref 150–400)
RBC: 4.16 MIL/uL (ref 3.87–5.11)
RDW: 12.4 % (ref 11.5–15.5)
WBC: 5.7 10*3/uL (ref 4.0–10.5)

## 2017-03-21 LAB — PREGNANCY, URINE: PREG TEST UR: NEGATIVE

## 2017-03-21 LAB — TYPE AND SCREEN
ABO/RH(D): O POS
ANTIBODY SCREEN: NEGATIVE

## 2017-03-21 LAB — ABO/RH: ABO/RH(D): O POS

## 2017-03-21 SURGERY — DILATATION & CURETTAGE/HYSTEROSCOPY WITH MYOSURE
Anesthesia: General | Site: Vagina

## 2017-03-21 MED ORDER — SCOPOLAMINE 1 MG/3DAYS TD PT72
1.0000 | MEDICATED_PATCH | Freq: Once | TRANSDERMAL | Status: DC
  Administered 2017-03-21: 1.5 mg via TRANSDERMAL

## 2017-03-21 MED ORDER — VASOPRESSIN 20 UNIT/ML IV SOLN
INTRAVENOUS | Status: AC
  Filled 2017-03-21: qty 1

## 2017-03-21 MED ORDER — EPHEDRINE SULFATE 50 MG/ML IJ SOLN
INTRAMUSCULAR | Status: DC | PRN
  Administered 2017-03-21: 5 mg via INTRAVENOUS

## 2017-03-21 MED ORDER — PROPOFOL 10 MG/ML IV BOLUS
INTRAVENOUS | Status: AC
  Filled 2017-03-21: qty 20

## 2017-03-21 MED ORDER — KETOROLAC TROMETHAMINE 30 MG/ML IJ SOLN
INTRAMUSCULAR | Status: AC
  Filled 2017-03-21: qty 1

## 2017-03-21 MED ORDER — PROPOFOL 10 MG/ML IV BOLUS
INTRAVENOUS | Status: DC | PRN
  Administered 2017-03-21: 200 mg via INTRAVENOUS

## 2017-03-21 MED ORDER — OXYCODONE-ACETAMINOPHEN 5-325 MG PO TABS: 1.0000 | ORAL_TABLET | ORAL | 0 refills | Status: DC | PRN

## 2017-03-21 MED ORDER — MIDAZOLAM HCL 2 MG/2ML IJ SOLN
INTRAMUSCULAR | Status: DC | PRN
  Administered 2017-03-21: 2 mg via INTRAVENOUS

## 2017-03-21 MED ORDER — GLYCOPYRROLATE 0.2 MG/ML IJ SOLN
INTRAMUSCULAR | Status: DC | PRN
  Administered 2017-03-21: .1 mg via INTRAVENOUS

## 2017-03-21 MED ORDER — FENTANYL CITRATE (PF) 100 MCG/2ML IJ SOLN
INTRAMUSCULAR | Status: DC | PRN
  Administered 2017-03-21: 50 ug via INTRAVENOUS

## 2017-03-21 MED ORDER — SODIUM CHLORIDE 0.9 % IR SOLN
Status: DC | PRN
  Administered 2017-03-21 (×3): 3000 mL

## 2017-03-21 MED ORDER — VASOPRESSIN 20 UNIT/ML IV SOLN
INTRAVENOUS | Status: DC | PRN
  Administered 2017-03-21: .3 [IU]

## 2017-03-21 MED ORDER — LIDOCAINE HCL (CARDIAC) 20 MG/ML IV SOLN
INTRAVENOUS | Status: AC
  Filled 2017-03-21: qty 5

## 2017-03-21 MED ORDER — EPHEDRINE 5 MG/ML INJ
INTRAVENOUS | Status: AC
  Filled 2017-03-21: qty 10

## 2017-03-21 MED ORDER — MIDAZOLAM HCL 2 MG/2ML IJ SOLN
INTRAMUSCULAR | Status: AC
  Filled 2017-03-21: qty 2

## 2017-03-21 MED ORDER — SCOPOLAMINE 1 MG/3DAYS TD PT72: 1.0000 | MEDICATED_PATCH | Freq: Once | TRANSDERMAL | Status: DC

## 2017-03-21 MED ORDER — LIDOCAINE HCL (CARDIAC) 20 MG/ML IV SOLN
INTRAVENOUS | Status: DC | PRN
  Administered 2017-03-21: 100 mg via INTRAVENOUS

## 2017-03-21 MED ORDER — IBUPROFEN 600 MG PO TABS: 600.0000 mg | ORAL_TABLET | Freq: Four times a day (QID) | ORAL | 1 refills | Status: DC | PRN

## 2017-03-21 MED ORDER — LACTATED RINGERS IV SOLN
INTRAVENOUS | Status: DC
  Administered 2017-03-21: 125 mL/h via INTRAVENOUS
  Administered 2017-03-21: 14:00:00 via INTRAVENOUS

## 2017-03-21 MED ORDER — DEXAMETHASONE SODIUM PHOSPHATE 4 MG/ML IJ SOLN
INTRAMUSCULAR | Status: AC
  Filled 2017-03-21: qty 1

## 2017-03-21 MED ORDER — KETOROLAC TROMETHAMINE 30 MG/ML IJ SOLN
INTRAMUSCULAR | Status: DC | PRN
  Administered 2017-03-21: 30 mg via INTRAVENOUS

## 2017-03-21 MED ORDER — BUPIVACAINE HCL 0.5 % IJ SOLN
INTRAMUSCULAR | Status: DC | PRN
  Administered 2017-03-21: 30 mL

## 2017-03-21 MED ORDER — DEXAMETHASONE SODIUM PHOSPHATE 10 MG/ML IJ SOLN
INTRAMUSCULAR | Status: DC | PRN
  Administered 2017-03-21: 4 mg via INTRAVENOUS

## 2017-03-21 MED ORDER — ACETAMINOPHEN 160 MG/5ML PO SOLN
960.0000 mg | Freq: Once | ORAL | Status: AC
  Administered 2017-03-21: 960 mg via ORAL

## 2017-03-21 MED ORDER — SCOPOLAMINE 1 MG/3DAYS TD PT72
MEDICATED_PATCH | TRANSDERMAL | Status: AC
  Administered 2017-03-21: 1.5 mg via TRANSDERMAL
  Filled 2017-03-21: qty 1

## 2017-03-21 MED ORDER — DOXYCYCLINE HYCLATE 100 MG IV SOLR
100.0000 mg | Freq: Two times a day (BID) | INTRAVENOUS | Status: DC
  Administered 2017-03-21: 100 mg via INTRAVENOUS
  Filled 2017-03-21 (×3): qty 100

## 2017-03-21 MED ORDER — ACETAMINOPHEN 160 MG/5ML PO SOLN
ORAL | Status: AC
  Administered 2017-03-21: 960 mg via ORAL
  Filled 2017-03-21: qty 40.6

## 2017-03-21 MED ORDER — ACETAMINOPHEN 500 MG PO TABS: 1000.0000 mg | ORAL_TABLET | Freq: Once | ORAL | Status: AC

## 2017-03-21 MED ORDER — ONDANSETRON HCL 4 MG/2ML IJ SOLN
INTRAMUSCULAR | Status: DC | PRN
  Administered 2017-03-21: 4 mg via INTRAVENOUS

## 2017-03-21 MED ORDER — FENTANYL CITRATE (PF) 100 MCG/2ML IJ SOLN
INTRAMUSCULAR | Status: AC
  Filled 2017-03-21: qty 2

## 2017-03-21 MED ORDER — CHLOROPROCAINE HCL 1 % IJ SOLN
INTRAMUSCULAR | Status: AC
  Filled 2017-03-21: qty 30

## 2017-03-21 MED ORDER — ONDANSETRON HCL 4 MG/2ML IJ SOLN
INTRAMUSCULAR | Status: AC
  Filled 2017-03-21: qty 2

## 2017-03-21 SURGICAL SUPPLY — 17 items
CATH ROBINSON RED A/P 16FR (CATHETERS) ×3 IMPLANT
DEVICE MYOSURE LITE (MISCELLANEOUS) IMPLANT
DEVICE MYOSURE REACH (MISCELLANEOUS) IMPLANT
FILTER ARTHROSCOPY CONVERTOR (FILTER) ×3 IMPLANT
GLOVE BIO SURGEON STRL SZ 6.5 (GLOVE) ×2 IMPLANT
GLOVE BIO SURGEONS STRL SZ 6.5 (GLOVE) ×1
GLOVE BIOGEL PI IND STRL 7.0 (GLOVE) ×2 IMPLANT
GLOVE BIOGEL PI INDICATOR 7.0 (GLOVE) ×4
GOWN STRL REUS W/TWL LRG LVL3 (GOWN DISPOSABLE) ×6 IMPLANT
MYOSURE XL FIBROID REM (MISCELLANEOUS) ×3
PACK VAGINAL MINOR WOMEN LF (CUSTOM PROCEDURE TRAY) ×3 IMPLANT
PAD OB MATERNITY 4.3X12.25 (PERSONAL CARE ITEMS) ×3 IMPLANT
SEAL ROD LENS SCOPE MYOSURE (ABLATOR) ×3 IMPLANT
SYSTEM TISS REMOVAL MYSR XL RM (MISCELLANEOUS) ×1 IMPLANT
TOWEL OR 17X24 6PK STRL BLUE (TOWEL DISPOSABLE) ×6 IMPLANT
TUBING AQUILEX INFLOW (TUBING) ×3 IMPLANT
TUBING AQUILEX OUTFLOW (TUBING) ×3 IMPLANT

## 2017-03-21 NOTE — Anesthesia Preprocedure Evaluation (Addendum)
Anesthesia Evaluation  Patient identified by MRN, date of birth, ID band Patient awake    Reviewed: Allergy & Precautions, NPO status , Patient's Chart, lab work & pertinent test results  Airway Mallampati: II  TM Distance: >3 FB Neck ROM: Full    Dental no notable dental hx.    Pulmonary neg pulmonary ROS,    Pulmonary exam normal breath sounds clear to auscultation       Cardiovascular negative cardio ROS Normal cardiovascular exam Rhythm:Regular Rate:Normal     Neuro/Psych negative neurological ROS  negative psych ROS   GI/Hepatic negative GI ROS, Neg liver ROS,   Endo/Other  negative endocrine ROS  Renal/GU negative Renal ROS  negative genitourinary   Musculoskeletal negative musculoskeletal ROS (+)   Abdominal   Peds  Hematology negative hematology ROS (+) anemia ,   Anesthesia Other Findings   Reproductive/Obstetrics negative OB ROS                            Lab Results  Component Value Date   WBC 5.7 03/21/2017   HGB 12.6 03/21/2017   HCT 37.7 03/21/2017   MCV 90.6 03/21/2017   PLT 227 03/21/2017    Anesthesia Physical Anesthesia Plan  ASA: II  Anesthesia Plan: General   Post-op Pain Management:    Induction: Intravenous  PONV Risk Score and Plan: 4 or greater and Treatment may vary due to age or medical condition, Ondansetron, Dexamethasone and Scopolamine patch - Pre-op  Airway Management Planned: LMA  Additional Equipment:   Intra-op Plan:   Post-operative Plan:   Informed Consent: I have reviewed the patients History and Physical, chart, labs and discussed the procedure including the risks, benefits and alternatives for the proposed anesthesia with the patient or authorized representative who has indicated his/her understanding and acceptance.   Dental advisory given  Plan Discussed with: CRNA and Anesthesiologist  Anesthesia Plan Comments:         Anesthesia Quick Evaluation

## 2017-03-21 NOTE — H&P (Signed)
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 Plan for hysteroscopic resection.  Past Medical History:  Diagnosis Date  . Anemia   . Cancer ([place])    thyroid  . Complication of anesthesia 2010   nausea/vomiting  . Dysrhythmia    heart palpitations  . Hypothyroidism     Past Surgical History:  Procedure Laterality Date  . BRAVO [place] STUDY N/A 07/29/2013   Procedure: BRAVO [place];  Surgeon: Arta Silence, MD;  Location: [facility] ENDOSCOPY;  Service: Endoscopy;  Laterality: N/A;  . CESAREAN SECTION  2003  . CHOLECYSTECTOMY    . DILATION AND CURETTAGE OF UTERUS    . ESOPHAGEAL MANOMETRY N/A 07/13/2013   Procedure: ESOPHAGEAL MANOMETRY (EM);  Surgeon: Arta Silence, MD;  Location: [facility] ENDOSCOPY;  Service: Endoscopy;  Laterality: N/A;  Patient will arrive @ 1015 to pre op for procedure until time for mano.   [facility] ESOPHAGOGASTRODUODENOSCOPY (EGD) WITH PROPOFOL N/A 07/29/2013   Procedure: ESOPHAGOGASTRODUODENOSCOPY (EGD) WITH PROPOFOL;  Surgeon: Arta Silence, MD;  Location: [facility] ENDOSCOPY;  Service: Endoscopy;  Laterality: N/A;  . THYROIDECTOMY  2016    All; ASA Meds: thyroid  PE: Vitals:   03/21/17 1147  BP: 109/73  Pulse: 69  Resp: 16  Temp: 98 F (36.7 C)  SpO2: 100%   Gen: well appearing, no distess Abd: soft, NT GUL def to OR LE: NT, no edema  CBC    Component Value Date/Time   WBC 5.7 03/21/2017 1130   RBC 4.16 03/21/2017 1130   HGB 12.6 03/21/2017 1130   HCT 37.7 03/21/2017 1130   PLT 227 03/21/2017 1130   MCV 90.6 03/21/2017 1130   MCH 30.3 03/21/2017 1130   MCHC 33.4 03/21/2017 1130   RDW 12.4 03/21/2017 1130   LYMPHSABS 2.4 11/29/2007 2210   MONOABS 0.4 12/16/2007 0822   EOSABS 0.1 12/16/2007 0822   BASOSABS 0.0 12/16/2007 2563    A/P: hysteroscopic myomectomy. Risks bleeding/ infetion d/w pt. May not fully control menorrhagia as IM fibroid will be left. Pt agrees and understands.  KAHLEN BOYDE 03/21/2017 1:14 PM

## 2017-03-21 NOTE — Anesthesia Postprocedure Evaluation (Signed)
Anesthesia Post Note  Patient: Dana Joseph  Procedure(s) Performed: DILATATION & CURETTAGE/HYSTEROSCOPY WITH MYOSURE (N/A Vagina )     Patient location during evaluation: PACU Anesthesia Type: General Level of consciousness: awake and alert Pain management: pain level controlled Vital Signs Assessment: post-procedure vital signs reviewed and stable Respiratory status: spontaneous breathing, nonlabored ventilation, respiratory function stable and patient connected to nasal cannula oxygen Cardiovascular status: blood pressure returned to baseline and stable Postop Assessment: no apparent nausea or vomiting Anesthetic complications: no    Last Vitals:  Vitals:   03/21/17 1500 03/21/17 1530  BP: 133/85   Pulse: 63 66  Resp: (!) 22 13  Temp:  36.6 C  SpO2: 100% 99%    Last Pain:  Vitals:   03/21/17 1530  TempSrc:   PainSc: 0-No pain   Pain Goal: Patients Stated Pain Goal: 4 (03/21/17 1530)               Barnet Glasgow

## 2017-03-21 NOTE — Brief Op Note (Signed)
03/21/2017  2:21 PM  PATIENT:  Dana Joseph  47 y.o. female  PRE-OPERATIVE DIAGNOSIS:  Uterine Fibroids, menorrhagia  POST-OPERATIVE DIAGNOSIS:  Uterine Fibroids, menorrhagia  PROCEDURE:  Procedure(s): DILATATION & CURETTAGE/HYSTEROSCOPY WITH MYOSURE (N/A)  SURGEON:  Surgeon(s) and Role:    Aloha Gell, MD - Primary  PHYSICIAN ASSISTANT: none  ASSISTANTS: none   ANESTHESIA:   local and general  EBL:  20 mL   BLOOD ADMINISTERED:none  DRAINS: none   LOCAL MEDICATIONS USED:  MARCAINE   , Amount: 30 cc ml and OTHER 6 units of vasopressin or 0.3 cc vasopressin  SPECIMEN:  Source of Specimen:  fibroid  DISPOSITION OF SPECIMEN:  PATHOLOGY  COUNTS:  YES  TOURNIQUET:  * No tourniquets in log *  DICTATION: .Note written in EPIC  PLAN OF CARE: Discharge to home after PACU  PATIENT DISPOSITION:  PACU - hemodynamically stable.   Delay start of Pharmacological VTE agent (>24hrs) due to surgical blood loss or risk of bleeding: yes

## 2017-03-21 NOTE — Op Note (Signed)
03/21/2017  2:21 PM  PATIENT:  Dana Joseph  47 y.o. female  PRE-OPERATIVE DIAGNOSIS:  Uterine Fibroids, menorrhagia  POST-OPERATIVE DIAGNOSIS:  Uterine Fibroids, menorrhagia  PROCEDURE:  Procedure(s): DILATATION & CURETTAGE/HYSTEROSCOPY WITH MYOSURE (N/A)  SURGEON:  Surgeon(s) and Role:    Aloha Gell, MD - Primary  PHYSICIAN ASSISTANT: none  ASSISTANTS: none   ANESTHESIA:   local and general  EBL:  20 mL   BLOOD ADMINISTERED:none  DRAINS: none   LOCAL MEDICATIONS USED:  MARCAINE   , Amount: 30 cc ml and OTHER 6 units of vasopressin or 0.3 cc vasopressin  SPECIMEN:  Source of Specimen:  fibroid  DISPOSITION OF SPECIMEN:  PATHOLOGY  COUNTS:  YES  TOURNIQUET:  * No tourniquets in log *  DICTATION: .Note written in EPIC  PLAN OF CARE: Discharge to home after PACU  PATIENT DISPOSITION:  PACU - hemodynamically stable.   Delay start of Pharmacological VTE agent (>24hrs) due to surgical blood loss or risk of bleeding: yes    Abx: 100mg  IV doxycycline  Findings: Visualization of bilateral ostia, the right ostia was somewhat obscured by endometrial tissue, large 2 x 2 centimeter mostly intracavitary fibroid, fundal intramural fibroid seen with no intracavitary component or cavitary distortion, hemostasis post-procedure  Indications:ntracavitary fibroids, menorrhagia, anemia   After informed consent including discussion of risks of bleeding, infection, perforation,  the patient was taken to the operating room where general anesthesia was initiated without difficulty. She was prepped and draped in normal sterile fashion in the dorsal supine lithotomy position.  A straight catheter was done for 75 cc of clear urine. A bimanual examination was done to assess the size and position of the uterus. A speculum was placed in the vagina and single tooth tenaculum used to grasp the anterior lip of the cervix. Local anaesthetic with vasopressin was injected at 5 and 7 o'clock in  there cervico-paracervical junction.   The cervix was then serially dilated to a #23  Pratt dilator. The obturator with hysteroscope was placed into the cervix and past the internal os nd intracavitary survey was done with findings as above. The trocar was removed and the operating blade was inserted. The minor sure XL blade was used.  Visual  Morcellation was carried out. At times the procedure needed to be halted to allow for reaccumulation of uterine distention and better visualization. The morcellation time was approximately 35 minutes with a deficit of 545 cc. Once the fibroid was completely removed uterine cavity was again inspected. Bilateral ostia were seen. At the fundus through a thin endometrium I could see the evidence of an intramural fundal fibroid. There was no intracavitary component and no significant cavitary distention. This fibroid was left in situ given the difficulty nd riskwith which a intramural morcellation would have carried.   The remainder of the uterus appeared normal. Hemostasis was noted.  The hysteroscope was then removed. Tenaculum was removed. The tenaculum site was hemostatic and the case was terminated. The patient tolerated the procedure well. Sponge, lap and needle counts were correct and the patient was taken to the recovery room in stable condition.   NURY NEBERGALL 03/21/2017 2:24 PM

## 2017-03-21 NOTE — Discharge Instructions (Addendum)
DISCHARGE INSTRUCTIONS: HYSTEROSCOPY / ENDOMETRIAL ABLATION The following instructions have been prepared to help you care for yourself upon your return home.  May Remove Scop patch on or before: Sunday January 27  May take Ibuprofen after: 8:00 pm tonight  May take stool softner while taking narcotic pain medication to prevent constipation.  Drink plenty of water.  Personal hygiene:  Use sanitary pads for vaginal drainage, not tampons.  Shower the day after your procedure.  NO tub baths, pools or Jacuzzis for 2-3 weeks.  Wipe front to back after using the bathroom.  Activity and limitations:  Do NOT drive or operate any equipment for 24 hours. The effects of anesthesia are still present and drowsiness may result.  Do NOT rest in bed all day.  Walking is encouraged.  Walk up and down stairs slowly.  You may resume your normal activity in one to two days or as indicated by your physician. Sexual activity: NO intercourse for at least 2 weeks after the procedure, or as indicated by your Doctor.  Diet: Eat a light meal as desired this evening. You may resume your usual diet tomorrow.  Return to Work: You may resume your work activities in one to two days or as indicated by Marine scientist.  What to expect after your surgery: Expect to have vaginal bleeding/discharge for 2-3 days and spotting for up to 10 days. It is not unusual to have soreness for up to 1-2 weeks. You may have a slight burning sensation when you urinate for the first day. Mild cramps may continue for a couple of days. You may have a regular period in 2-6 weeks.  Call your doctor for any of the following:  Excessive vaginal bleeding or clotting, saturating and changing one pad every hour.  Inability to urinate 6 hours after discharge from hospital.  Pain not relieved by pain medication.  Fever of 100.4 F or greater.  Unusual vaginal discharge or odor.  Return to office _________________Call for an  appointment ___________________ Patients signature: ______________________ Nurses signature ________________________  Post Anesthesia Care Unit [phone]   Post Anesthesia Home Care Instructions  Activity: Get plenty of rest for the remainder of the day. A responsible individual must stay with you for 24 hours following the procedure.  For the next 24 hours, DO NOT: -Drive a car -Paediatric nurse -Drink alcoholic beverages -Take any medication unless instructed by your physician -Make any legal decisions or sign important papers.  Meals: Start with liquid foods such as gelatin or soup. Progress to regular foods as tolerated. Avoid greasy, spicy, heavy foods. If nausea and/or vomiting occur, drink only clear liquids until the nausea and/or vomiting subsides. Call your physician if vomiting continues.  Special Instructions/Symptoms: Your throat may feel dry or sore from the anesthesia or the breathing tube placed in your throat during surgery. If this causes discomfort, gargle with warm salt water. The discomfort should disappear within 24 hours.  If you had a scopolamine patch placed behind your ear for the management of post- operative nausea and/or vomiting:  1. The medication in the patch is effective for 72 hours, after which it should be removed.  Wrap patch in a tissue and discard in the trash. Wash hands thoroughly with soap and water. 2. You may remove the patch earlier than 72 hours if you experience unpleasant side effects which may include dry mouth, dizziness or visual disturbances. 3. Avoid touching the patch. Wash your hands with soap and water after contact with the patch.

## 2017-03-21 NOTE — Anesthesia Procedure Notes (Signed)
Procedure Name: LMA Insertion Date/Time: 03/21/2017 1:30 PM Performed by: Kathie Rhodes, CRNA Pre-anesthesia Checklist: Patient identified Patient Re-evaluated:Patient Re-evaluated prior to induction Oxygen Delivery Method: Circle system utilized Preoxygenation: Pre-oxygenation with 100% oxygen Induction Type: IV induction LMA: LMA inserted LMA Size: 4.0 Tube type: Oral Number of attempts: 1 Tube secured with: Tape

## 2017-03-21 NOTE — Transfer of Care (Signed)
Immediate Anesthesia Transfer of Care Note  Patient: Dana Joseph  Procedure(s) Performed: DILATATION & CURETTAGE/HYSTEROSCOPY WITH MYOSURE (N/A Vagina )  Patient Location: PACU  Anesthesia Type:General  Level of Consciousness: drowsy  Airway & Oxygen Therapy: Patient Spontanous Breathing and Patient connected to nasal cannula oxygen  Post-op Assessment: Report given to RN and Post -op Vital signs reviewed and stable  Post vital signs: Reviewed and stable  Last Vitals:  Vitals:   03/21/17 1147  BP: 109/73  Pulse: 69  Resp: 16  Temp: 36.7 C  SpO2: 100%    Last Pain:  Vitals:   03/21/17 1147  TempSrc: Oral      Patients Stated Pain Goal: 4 (43/88/87 5797)  Complications: No apparent anesthesia complications

## 2017-03-22 ENCOUNTER — Encounter (HOSPITAL_COMMUNITY): Payer: Self-pay | Admitting: Obstetrics

## 2017-05-17 ENCOUNTER — Other Ambulatory Visit: Payer: Self-pay | Admitting: Obstetrics

## 2017-05-17 DIAGNOSIS — Z1231 Encounter for screening mammogram for malignant neoplasm of breast: Secondary | ICD-10-CM

## 2017-06-07 ENCOUNTER — Ambulatory Visit
Admission: RE | Admit: 2017-06-07 | Discharge: 2017-06-07 | Disposition: A | Discharge status: Home / Self Care | Payer: BLUE CROSS/BLUE SHIELD | Source: Ambulatory Visit | Attending: Obstetrics | Admitting: Obstetrics

## 2017-06-07 DIAGNOSIS — Z1231 Encounter for screening mammogram for malignant neoplasm of breast: Secondary | ICD-10-CM

## 2018-02-26 HISTORY — PX: COLONOSCOPY: SHX174

## 2018-04-10 ENCOUNTER — Encounter: Payer: Self-pay | Admitting: Gastroenterology

## 2018-04-10 ENCOUNTER — Ambulatory Visit (INDEPENDENT_AMBULATORY_CARE_PROVIDER_SITE_OTHER): Payer: BLUE CROSS/BLUE SHIELD | Admitting: Gastroenterology

## 2018-04-10 VITALS — BP 112/74 | HR 78 | Ht 67.0 in | Wt 127.1 lb

## 2018-04-10 DIAGNOSIS — Z86010 Personal history of colonic polyps: Secondary | ICD-10-CM

## 2018-04-10 MED ORDER — NA SULFATE-K SULFATE-MG SULF 17.5-3.13-1.6 GM/177ML PO SOLN: 1.0000 | ORAL | 0 refills | Status: DC

## 2018-04-10 MED ORDER — ONDANSETRON 4 MG PO TBDP: 4.0000 mg | ORAL_TABLET | ORAL | 0 refills | Status: DC

## 2018-04-10 NOTE — Progress Notes (Signed)
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 Paulita Fujita with manometry and pH studies     - Not thought to be achalasia    - Seen by [facility] at [facility] in 2015 Intermittent pill dysphagia Family history of colon cancer (maternal grandmother) Lifetime history of constipation Cystectomy for biliary dyskinesia  She is due surveillance colonoscopy given her history of colon polyps.  We discussed strategies to minimize prep associated nausea and prep failure.  Reviewed her chronic constipation.  She does not feel that further evaluation or intervention is indicated at this time.  Reviewed her diagnosis of hypertensive lower and upper esophageal sphincters.  She is satisfied with her current management strategy focused on deep, relaxation breaths.  PLAN: Obtain prior records from Dr. Paulita Fujita and [facility] Miralax 17 g daily x 3 days prior to colonoscopy Zofran SL taken prior to colon prep Colonoscopy  I consented the patient at the bedside today discussing the risks, benefits, and alternatives to endoscopic evaluation. In particular, we discussed the risks that include, but are not limited to, reaction to medication, cardiopulmonary compromise, bleeding requiring blood transfusion, aspiration resulting in pneumonia, perforation requiring surgery, lack of diagnosis, severe illness requiring hospitalization, and even death. We reviewed the risk of missed lesion including polyps or even cancer. The patient acknowledges these risks and asks that we proceed.  HPI: Dana Joseph is a 48 y.o. 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 Self referred to discuss colonoscopy. Moved from [place] 10 years  ago due to husband's transfer with [facility].  I actually met her in [place] and performed her initial screening colonoscopy.  The history is obtained through the patient and review of her electronic health record.  This includes records in [facility] regarding consultation at [facility]. 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   She started colon cancer early given a family history of colon cancer in a maternal grandmother.  No other known family history of colon cancer or polyps.  She reports 4 total colonoscopies.  The last was performed in 2015 by Dr. Paulita Fujita.  She had polyps on her first colonoscopy.  She is not aware of polyps on any of her other endoscopic exams.  Severe nausea with colon prep in the past.  Poor prep with need for two day prep recommended by Dr. Paulita Fujita.  Lifetime history of constipation. BM most days. Does not feel this needs further evaluation or treatment at this time.  Esophageal "cramping" that developed after cholecystectomy for biliary dyskinesia. Symptoms occur sporadically. Sometimes a couple times in one week, often a couple of months.  Episodes start as a cramp and can often be improved by food.  Episodes last approximately 15 to 30 minutes.  No improvement to long-term PPI therapy.  Intermittent pill dysphagia. No odynophagia or dysphagia. She has no exertional angina, no pleuritic symptoms, no cough, no dyspnea. She is able to control her symptoms with deep breathing.  This was previously evaluated by Dr. Paulita Fujita and the patient was referred to [facility]. Seen by Dr. Alyson Locket for hypertensive lower and upper esophageal sphincters.  No achalasia. No formal follow-up recommended.  She feels like she is successfully able to manage her symptoms with deep breathing now  that she has been reassured about the etiology.    Past Medical History:  Diagnosis Date  . Anemia   . Cancer ([place])    thyroid  . Colon polyps   . Complication of anesthesia 2010    nausea/vomiting  . Dysrhythmia    heart palpitations  . Hypothyroidism     Past Surgical History:  Procedure Laterality Date  . BRAVO [place] STUDY N/A 07/29/2013   Procedure: BRAVO [place];  Surgeon: Arta Silence, MD;  Location: [facility];  Service: Endoscopy;  Laterality: N/A;  . CESAREAN SECTION  2003  . CHOLECYSTECTOMY    . COLONOSCOPY  2015   [facility] Dr Paulita Fujita  . DILATATION & CURETTAGE/HYSTEROSCOPY WITH MYOSURE N/A 03/21/2017   Procedure: DILATATION & CURETTAGE/HYSTEROSCOPY WITH MYOSURE;  Surgeon: Aloha Gell, MD;  Location: [facility];  Service: Gynecology;  Laterality: N/A;  . DILATION AND CURETTAGE OF UTERUS    . ESOPHAGEAL MANOMETRY N/A 07/13/2013   Procedure: ESOPHAGEAL MANOMETRY (EM);  Surgeon: Arta Silence, MD;  Location: [facility];  Service: Endoscopy;  Laterality: N/A;  Patient will arrive @ 1015 to pre op for procedure until time for mano.   Marland Kitchen ESOPHAGOGASTRODUODENOSCOPY (EGD) WITH PROPOFOL N/A 07/29/2013   Procedure: ESOPHAGOGASTRODUODENOSCOPY (EGD) WITH PROPOFOL;  Surgeon: Arta Silence, MD;  Location: [facility];  Service: Endoscopy;  Laterality: N/A;  . THYROIDECTOMY  2016    Current Outpatient Medications  Medication Sig Dispense Refill  . Calcium-Vitamin D-Vitamin K (VIACTIV) [phone] MG-UNT-MCG CHEW Chew 1 tablet by mouth daily at 12 noon.    Marland Kitchen ibuprofen (ADVIL,MOTRIN) 200 MG tablet Take 200 mg by mouth every 8 (eight) hours as needed for mild pain (for pain).     Marland Kitchen levothyroxine (SYNTHROID, LEVOTHROID) 112 MCG tablet Take 112 mcg by mouth daily before breakfast.    . Magnesium 250 MG TABS Take 250 mg by mouth daily at 12 noon.    . Melatonin 3 MG TABS Take 3 mg by mouth at bedtime.    . Multiple Vitamin (MULTIVITAMIN WITH MINERALS) TABS tablet Take 1 tablet by mouth as needed.     . Probiotic Product (PROBIOTIC PO) Take 1 capsule by mouth daily.    . ranitidine (ZANTAC) 150 MG tablet Take 150 mg by mouth as needed for heartburn.     . triamcinolone  (NASACORT) 55 MCG/ACT AERO nasal inhaler Place into the nose as needed.    . Na Sulfate-K Sulfate-Mg Sulf 17.5-3.13-1.6 GM/177ML SOLN Take 1 kit by mouth as directed for 30 days. 354 mL 0  . ondansetron (ZOFRAN ODT) 4 MG disintegrating tablet Take 1 tablet (4 mg total) by mouth as directed. 2 tablet 0   No current facility-administered medications for this visit.     Allergies as of 04/10/2018 - Review Complete 04/10/2018  Allergen Reaction Noted  . Aspirin Itching 12/08/2007    Family History  Problem Relation Age of Onset  . Breast cancer Maternal Grandmother   . Colon cancer Maternal Grandmother   . Hypertension Mother   . Heart disease Father   . Esophageal cancer Neg Hx     Social History   Socioeconomic History  . Marital status: Married    Spouse name: Not on file  . Number of children: 2  . Years of education: Not on file  . Highest education level: Not on file  Occupational History  . Occupation: Pharmacist, community   Social Needs  . Financial resource strain: Not on file  . Food insecurity:  Worry: Not on file    Inability: Not on file  . Transportation needs:    Medical: Not on file    Non-medical: Not on file  Tobacco Use  . Smoking status: Never Smoker  . Smokeless tobacco: Never Used  Substance and Sexual Activity  . Alcohol use: Not Currently  . Drug use: Never  . Sexual activity: Not on file  Lifestyle  . Physical activity:    Days per week: Not on file    Minutes per session: Not on file  . Stress: Not on file  Relationships  . Social connections:    Talks on phone: Not on file    Gets together: Not on file    Attends religious service: Not on file    Active member of club or organization: Not on file    Attends meetings of clubs or organizations: Not on file    Relationship status: Not on file  . Intimate partner violence:    Fear of current or ex partner: Not on file    Emotionally abused: Not on file    Physically abused: Not on file     Forced sexual activity: Not on file  Other Topics Concern  . Not on file  Social History Narrative  . Not on file    Review of Systems: 12 system ROS is negative except as noted above.  Filed Weights   04/10/18 1001  Weight: 127 lb 2 oz (57.7 kg)    Physical Exam: Vital signs were reviewed. General:   Alert, well-nourished, pleasant and cooperative in NAD Head:  Normocephalic and atraumatic. Eyes:  Sclera clear, no icterus.   Conjunctiva pink. Mouth:  No deformity or lesions.   Neck:  Supple; no thyromegaly. Lungs:  Clear throughout to auscultation.   No wheezes.  Heart:  Regular rate and rhythm; no murmurs Abdomen:  Soft, nontender, normal bowel sounds. No rebound or guarding. No hepatosplenomegaly Rectal:  Deferred  Msk:  Symmetrical without gross deformities. Extremities:  No gross deformities or edema. Neurologic:  Alert and  oriented x4;  grossly nonfocal Skin:  No rash or bruise. Psych:  Alert and cooperative. Normal mood and affect.   Avin Upperman L. Tarri Glenn, MD, MPH [facility] 04/10/2018, 1:08 PM

## 2018-04-10 NOTE — Patient Instructions (Signed)
You have been scheduled for a colonoscopy. Please follow written instructions given to you at your visit today.  Please pick up your prep supplies at the pharmacy within the next 1-3 days. If you use inhalers (even only as needed), please bring them with you on the day of your procedure. Your physician has requested that you go to www.startemmi.com and enter the access code given to you at your visit today. This web site gives a general overview about your procedure. However, you should still follow specific instructions given to you by our office regarding your preparation for the procedure.  Take Miralax 17 grams x 3 days prior to your colonoscopy.   Take Zofran 30 minutes before colon prep.

## 2018-05-06 ENCOUNTER — Encounter: Payer: Self-pay | Admitting: Gastroenterology

## 2018-05-06 ENCOUNTER — Other Ambulatory Visit: Payer: Self-pay

## 2018-05-06 ENCOUNTER — Ambulatory Visit (AMBULATORY_SURGERY_CENTER): Payer: BLUE CROSS/BLUE SHIELD | Admitting: Gastroenterology

## 2018-05-06 VITALS — BP 103/60 | HR 57 | Temp 99.5°F | Resp 10 | Ht 67.0 in | Wt 127.0 lb

## 2018-05-06 DIAGNOSIS — Z86010 Personal history of colonic polyps: Secondary | ICD-10-CM

## 2018-05-06 MED ORDER — SODIUM CHLORIDE 0.9 % IV SOLN: 500.0000 mL | Freq: Once | INTRAVENOUS | Status: DC

## 2018-05-06 NOTE — Op Note (Signed)
[place] Patient Name: Dana Joseph Procedure Date: 05/06/2018 2:57 PM MRN: [id number] Endoscopist: Thornton Park MD, MD Age: 48 Referring MD:  Date of Birth: 10-27-1970 Gender: Female Account #: [id number] Procedure:                Colonoscopy Indications:              Surveillance: Personal history of adenomatous                            polyps on last colonoscopy 5 years . She started                            colon cancer early given a family history of colon                            cancer in a maternal grandmother. No other known                            family history of colon cancer or polyps. She                            reports 4 total colonoscopies. The last was                            performed in 2015 by Dr. Paulita Fujita. She had polyps on                            her first colonoscopy. She is not aware of polyps                            on any of her other endoscopic exams. Medicines:                See the Anesthesia note for documentation of the                            administered medications Procedure:                Pre-Anesthesia Assessment:                           - Prior to the procedure, a History and Physical                            was performed, and patient medications and                            allergies were reviewed. The patient's tolerance of                            previous anesthesia was also reviewed. The risks                            and benefits of the procedure and the sedation  options and risks were discussed with the patient.                            All questions were answered, and informed consent                            was obtained. Prior Anticoagulants: The patient has                            taken no previous anticoagulant or antiplatelet                            agents. ASA Grade Assessment: II - A patient with                            mild systemic disease.  After reviewing the risks                            and benefits, the patient was deemed in                            satisfactory condition to undergo the procedure.                           After obtaining informed consent, the colonoscope                            was passed under direct vision. Throughout the                            procedure, the patient's blood pressure, pulse, and                            oxygen saturations were monitored continuously. The                            Colonoscope was introduced through the anus and                            advanced to the the terminal ileum, with                            identification of the appendiceal orifice and IC                            valve. The colonoscopy was technically difficult                            and complex due to a redundant colon and                            significant looping. Successful completion of the  procedure was aided by changing the patient to a                            supine position and applying abdominal pressure.                            The patient tolerated the procedure well. The                            quality of the bowel preparation was excellent. The                            terminal ileum, ileocecal valve, appendiceal                            orifice, and rectum were photographed. Scope In: 3:11:45 PM Scope Out: 3:30:59 PM Scope Withdrawal Time: 0 hours 10 minutes 59 seconds  Total Procedure Duration: 0 hours 19 minutes 14 seconds  Findings:                 The perianal and digital rectal examinations were                            normal.                           The colon (entire examined portion) appeared                            normal. However, the colon is redundant and                            tortuous. Complications:            No immediate complications. Estimated Blood Loss:     Estimated blood loss:  none. Impression:               - The entire examined colon is normal.                           - No specimens collected. Recommendation:           - Patient has a contact number available for                            emergencies. The signs and symptoms of potential                            delayed complications were discussed with the                            patient. Return to normal activities tomorrow.                            Written discharge instructions were provided to the  patient.                           - Resume regular diet.                           - Continue present medications.                           - Repeat colonoscopy in 10 years for surveillance.                           - Colonoscope should be used for future procedures. Thornton Park MD, MD 05/06/2018 3:39:58 PM This report has been signed electronically.

## 2018-05-06 NOTE — Patient Instructions (Signed)
YOU HAD AN ENDOSCOPIC PROCEDURE TODAY AT THE [facility]:   Refer to the procedure report that was given to you for any specific questions about what was found during the examination.  If the procedure report does not answer your questions, please call your gastroenterologist to clarify.  If you requested that your care partner not be given the details of your procedure findings, then the procedure report has been included in a sealed envelope for you to review at your convenience later.  YOU SHOULD EXPECT: Some feelings of bloating in the abdomen. Passage of more gas than usual.  Walking can help get rid of the air that was put into your GI tract during the procedure and reduce the bloating. If you had a lower endoscopy (such as a colonoscopy or flexible sigmoidoscopy) you may notice spotting of blood in your stool or on the toilet paper. If you underwent a bowel prep for your procedure, you may not have a normal bowel movement for a few days.  Please Note:  You might notice some irritation and congestion in your nose or some drainage.  This is from the oxygen used during your procedure.  There is no need for concern and it should clear up in a day or so.  SYMPTOMS TO REPORT IMMEDIATELY:   Following lower endoscopy (colonoscopy or flexible sigmoidoscopy):  Excessive amounts of blood in the stool  Significant tenderness or worsening of abdominal pains  Swelling of the abdomen that is new, acute  Fever of 100F or higher  For urgent or emergent issues, a gastroenterologist can be reached at any hour by calling [phone].   DIET:  We do recommend a small meal at first, but then you may proceed to your regular diet.  Drink plenty of fluids but you should avoid alcoholic beverages for 24 hours.  ACTIVITY:  You should plan to take it easy for the rest of today and you should NOT DRIVE or use heavy machinery until tomorrow (because of the sedation medicines used during the test).     FOLLOW UP: Our staff will call the number listed on your records the next business day following your procedure to check on you and address any questions or concerns that you may have regarding the information given to you following your procedure. If we do not reach you, we will leave a message.  However, if you are feeling well and you are not experiencing any problems, there is no need to return our call.  We will assume that you have returned to your regular daily activities without incident.  If any biopsies were taken you will be contacted by phone or by letter within the next 1-3 weeks.  Please call us at [phone] if you have not heard about the biopsies in 3 weeks.    SIGNATURES/CONFIDENTIALITY: You and/or your care partner have signed paperwork which will be entered into your electronic medical record.  These signatures attest to the fact that that the information above on your After Visit Summary has been reviewed and is understood.  Full responsibility of the confidentiality of this discharge information lies with you and/or your care-partner. 

## 2018-05-07 ENCOUNTER — Telehealth: Payer: Self-pay

## 2018-05-07 NOTE — Telephone Encounter (Signed)
  Follow up Call-  Call back number 05/06/2018  Post procedure Call Back phone  # [phone]  Permission to leave phone message Yes  Some recent data might be hidden     Patient questions:  Do you have a fever, pain , or abdominal swelling? No. Pain Score  0 *  Have you tolerated food without any problems? Yes.    Have you been able to return to your normal activities? Yes.    Do you have any questions about your discharge instructions: Diet   No. Medications  No. Follow up visit  No.  Do you have questions or concerns about your Care? No.  Actions: * If pain score is 4 or above: No action needed, pain <4.

## 2018-05-12 ENCOUNTER — Other Ambulatory Visit: Payer: Self-pay | Admitting: Obstetrics

## 2018-05-12 DIAGNOSIS — Z1231 Encounter for screening mammogram for malignant neoplasm of breast: Secondary | ICD-10-CM

## 2018-05-26 ENCOUNTER — Encounter: Payer: BLUE CROSS/BLUE SHIELD | Admitting: Gastroenterology

## 2018-08-01 ENCOUNTER — Other Ambulatory Visit: Payer: Self-pay

## 2018-08-01 ENCOUNTER — Ambulatory Visit
Admission: RE | Admit: 2018-08-01 | Discharge: 2018-08-01 | Disposition: A | Discharge status: Home / Self Care | Payer: BLUE CROSS/BLUE SHIELD | Source: Ambulatory Visit | Attending: Obstetrics | Admitting: Obstetrics

## 2018-08-01 DIAGNOSIS — Z1231 Encounter for screening mammogram for malignant neoplasm of breast: Secondary | ICD-10-CM

## 2019-07-13 ENCOUNTER — Other Ambulatory Visit: Payer: Self-pay | Admitting: Family Medicine

## 2019-07-13 DIAGNOSIS — Z1231 Encounter for screening mammogram for malignant neoplasm of breast: Secondary | ICD-10-CM

## 2019-08-07 ENCOUNTER — Ambulatory Visit
Admission: RE | Admit: 2019-08-07 | Discharge: 2019-08-07 | Disposition: A | Discharge status: Home / Self Care | Payer: BC Managed Care – PPO | Source: Ambulatory Visit

## 2019-08-07 ENCOUNTER — Other Ambulatory Visit: Payer: Self-pay

## 2019-08-07 DIAGNOSIS — Z1231 Encounter for screening mammogram for malignant neoplasm of breast: Secondary | ICD-10-CM

## 2019-08-12 ENCOUNTER — Other Ambulatory Visit: Payer: Self-pay | Admitting: Family Medicine

## 2019-08-12 DIAGNOSIS — R928 Other abnormal and inconclusive findings on diagnostic imaging of breast: Secondary | ICD-10-CM

## 2019-08-21 ENCOUNTER — Ambulatory Visit
Admission: RE | Admit: 2019-08-21 | Discharge: 2019-08-21 | Disposition: A | Discharge status: Home / Self Care | Payer: BC Managed Care – PPO | Source: Ambulatory Visit | Attending: Family Medicine | Admitting: Family Medicine

## 2019-08-21 ENCOUNTER — Other Ambulatory Visit: Payer: Self-pay

## 2019-08-21 DIAGNOSIS — R928 Other abnormal and inconclusive findings on diagnostic imaging of breast: Secondary | ICD-10-CM

## 2020-07-13 ENCOUNTER — Other Ambulatory Visit: Payer: Self-pay | Admitting: Family Medicine

## 2020-07-13 DIAGNOSIS — Z1231 Encounter for screening mammogram for malignant neoplasm of breast: Secondary | ICD-10-CM

## 2020-09-08 ENCOUNTER — Ambulatory Visit
Admission: RE | Admit: 2020-09-08 | Discharge: 2020-09-08 | Disposition: A | Discharge status: Home / Self Care | Payer: BC Managed Care – PPO | Source: Ambulatory Visit

## 2020-09-08 ENCOUNTER — Other Ambulatory Visit: Payer: Self-pay

## 2020-09-08 DIAGNOSIS — Z1231 Encounter for screening mammogram for malignant neoplasm of breast: Secondary | ICD-10-CM

## 2020-11-09 ENCOUNTER — Other Ambulatory Visit: Payer: Self-pay

## 2020-11-09 ENCOUNTER — Encounter (HOSPITAL_BASED_OUTPATIENT_CLINIC_OR_DEPARTMENT_OTHER): Payer: Self-pay | Admitting: Obstetrics

## 2020-11-09 DIAGNOSIS — Z973 Presence of spectacles and contact lenses: Secondary | ICD-10-CM

## 2020-11-09 HISTORY — DX: Presence of spectacles and contact lenses: Z97.3

## 2020-11-09 NOTE — Progress Notes (Signed)
Spoke w/ via phone for pre-op interview---pt Lab needs dos----  urine poct, surgery orders pending             Lab results------lap appt 11-15-2020 cbc t & s COVID test -----patient states asymptomatic no test needed Arrive at -------1100 am 11-17-2020 NPO after MN NO Solid Food.  Clear liquids from MN until---1000 am Med rec completed Medications to take morning of surgery -----levothyroxine Diabetic medication -----n/a Patient instructed no nail polish to be worn day of surgery Patient instructed to bring photo id and insurance card day of surgery Patient aware to have Driver (ride ) / caregiver  spouse todd   for 24 hours after surgery  Patient Special Instructions -----pt given overnight stay instructions Pre-Op special Istructions -----orders req dr Pamala Hurry epic ib Patient verbalized understanding of instructions that were given at this phone interview. Patient denies shortness of breath, chest pain, fever, cough at this phone interview.

## 2020-11-09 NOTE — Progress Notes (Signed)
YOU ARE SCHEDULED FOR A COVID TEST ON   11-15-2020  . THIS TEST MUST BE DONE BEFORE SURGERY. GO TO  [facility] @ [place]   PHONE NUMBER [phone].   TURN LEFT AT THE SHIPPING AND RECEIVING SIGN AND LOOK FOR SMALL BLUE POP UP TENT UNDER BUILDING OVERHANG AT BACK OF BUILDING AND REMAIN IN YOUR CAR, THIS IS A DRIVE UP TEST.  AFTER YOUR COVID TEST , PLEASE WEAR A MASK OUT IN PUBLIC AND SOCIAL DISTANCE AND [place] YOUR HANDS FREQUENTLY. PLEASE ASK ALL YOUR CLOSE HOUSEHOLD CONTACT TO WEAR MASK OUT IN PUBLIC AND SOCIAL DISTANCE AND [place] HANDS FREQUENTLY ALSO.      Your procedure is scheduled on 11-17-2020  Report to [facility].   Call this number if you have problems the morning of surgery  :[phone].   OUR ADDRESS IS [place].  WE ARE LOCATED IN [facility].  PLEASE BRING YOUR INSURANCE CARD AND PHOTO ID DAY OF SURGERY.  ONLY ONE PERSON ALLOWED IN FACILITY WAITING AREA.                                     REMEMBER:  DO NOT EAT FOOD, CANDY GUM OR MINTS  AFTER MIDNIGHT . YOU MAY HAVE CLEAR LIQUIDS FROM MIDNIGHT UNTIL 1000 AM. NO CLEAR LIQUIDS AFTER 1000 AM DAY OF SURGERY.   YOU MAY  BRUSH YOUR TEETH MORNING OF SURGERY AND RINSE YOUR MOUTH OUT, NO CHEWING GUM CANDY OR MINTS.    CLEAR LIQUID DIET   Foods Allowed                                                                     Foods Excluded  Coffee and tea, regular and decaf                             liquids that you cannot  Plain Jell-O any favor except red or purple                                           see through such as: Fruit ices (not with fruit pulp)                                     milk, soups, orange juice  Iced Popsicles                                    All solid food Carbonated beverages, regular and diet                                    Cranberry, grape and apple juices Sports drinks like Gatorade Lightly  seasoned clear broth  or consume(fat free) Sugar  Sample Menu Breakfast                                Lunch                                     Supper Cranberry juice                    Beef broth                            Chicken broth Jell-O                                     Grape juice                           Apple juice Coffee or tea                        Jell-O                                      Popsicle                                                Coffee or tea                        Coffee or tea  _____________________________________________________________________     TAKE THESE MEDICATIONS MORNING OF SURGERY WITH A SIP OF WATER:LEVOTHYROXINE  ONE VISITOR IS ALLOWED IN WAITING ROOM ONLY DAY OF SURGERY.  NO VISITOR MAY SPEND THE NIGHT.  VISITOR ARE ALLOWED TO STAY UNTIL 800 PM.                                    DO NOT WEAR JEWERLY, MAKE UP. DO NOT WEAR LOTIONS, POWDERS, PERFUMES OR NAIL POLISH. DO NOT SHAVE FOR 48 HOURS PRIOR TO DAY OF SURGERY. MEN MAY SHAVE FACE AND NECK. CONTACTS, GLASSES, OR DENTURES MAY NOT BE WORN TO SURGERY.                                    [facility] IS NOT RESPONSIBLE  FOR ANY BELONGINGS.                                                                    Marland Kitchen           [facility] - Preparing for Surgery Before surgery, you can play an important role.  Because skin is not  sterile, your skin needs to be as free of germs as possible.  You can reduce the number of germs on your skin by washing with CHG (chlorahexidine gluconate) soap before surgery.  CHG is an antiseptic cleaner which kills germs and bonds with the skin to continue killing germs even after washing. Please DO NOT use if you have an allergy to CHG or antibacterial soaps.  If your skin becomes reddened/irritated stop using the CHG and inform your nurse when you arrive at Short Stay. Do not shave (including legs and underarms) for at least 48 hours prior to the first CHG shower.  You  may shave your face/neck. Please follow these instructions carefully:  1.  Shower with CHG Soap the night before surgery and the  morning of Surgery.  2.  If you choose to wash your hair, wash your hair first as usual with your  normal  shampoo.  3.  After you shampoo, rinse your hair and body thoroughly to remove the  shampoo.                            4.  Use CHG as you would any other liquid soap.  You can apply chg directly  to the skin and wash                      Gently with a scrungie or clean washcloth.  5.  Apply the CHG Soap to your body ONLY FROM THE NECK DOWN.   Do not use on face/ open                           Wound or open sores. Avoid contact with eyes, ears mouth and genitals (private parts).                       Wash face,  Genitals (private parts) with your normal soap.             6.  Wash thoroughly, paying special attention to the area where your surgery  will be performed.  7.  Thoroughly rinse your body with warm water from the neck down.  8.  DO NOT shower/wash with your normal soap after using and rinsing off  the CHG Soap.                9.  Pat yourself dry with a clean towel.            10.  Wear clean pajamas.            11.  Place clean sheets on your bed the night of your first shower and do not  sleep with pets. Day of Surgery : Do not apply any lotions/deodorants the morning of surgery.  Please wear clean clothes to the hospital/surgery center.  FAILURE TO FOLLOW THESE INSTRUCTIONS MAY RESULT IN THE CANCELLATION OF YOUR SURGERY PATIENT SIGNATURE_________________________________  NURSE SIGNATURE__________________________________  ________________________________________________________________________                                                        QUESTIONS Hansel Feinstein PRE OP NURSE PHONE [phone].

## 2020-11-10 ENCOUNTER — Other Ambulatory Visit: Payer: Self-pay | Admitting: Obstetrics

## 2020-11-15 ENCOUNTER — Other Ambulatory Visit: Payer: Self-pay

## 2020-11-15 ENCOUNTER — Encounter (HOSPITAL_COMMUNITY)
Admission: RE | Admit: 2020-11-15 | Discharge: 2020-11-15 | Disposition: A | Discharge status: Home / Self Care | Payer: BC Managed Care – PPO | Source: Ambulatory Visit | Attending: Obstetrics | Admitting: Obstetrics

## 2020-11-15 ENCOUNTER — Other Ambulatory Visit: Payer: Self-pay | Admitting: Obstetrics

## 2020-11-15 DIAGNOSIS — Z01812 Encounter for preprocedural laboratory examination: Secondary | ICD-10-CM | POA: Insufficient documentation

## 2020-11-15 LAB — CBC
HCT: 42.7 % (ref 36.0–46.0)
Hemoglobin: 14.1 g/dL (ref 12.0–15.0)
MCH: 31.9 pg (ref 26.0–34.0)
MCHC: 33 g/dL (ref 30.0–36.0)
MCV: 96.6 fL (ref 80.0–100.0)
Platelets: 213 10*3/uL (ref 150–400)
RBC: 4.42 MIL/uL (ref 3.87–5.11)
RDW: 12.9 % (ref 11.5–15.5)
WBC: 6.5 10*3/uL (ref 4.0–10.5)
nRBC: 0 % (ref 0.0–0.2)

## 2020-11-15 LAB — BASIC METABOLIC PANEL
Anion gap: 6 (ref 5–15)
BUN: 27 mg/dL — ABNORMAL HIGH (ref 6–20)
CO2: 27 mmol/L (ref 22–32)
Calcium: 9.4 mg/dL (ref 8.9–10.3)
Chloride: 107 mmol/L (ref 98–111)
Creatinine, Ser: 0.69 mg/dL (ref 0.44–1.00)
GFR, Estimated: >60 mL/min (ref >60)
Glucose, Bld: 95 mg/dL (ref 70–99)
Potassium: 4.7 mmol/L (ref 3.5–5.1)
Sodium: 140 mmol/L (ref 135–145)

## 2020-11-16 LAB — SARS CORONAVIRUS 2 (TAT 6-24 HRS): SARS Coronavirus 2: NEGATIVE

## 2020-11-16 NOTE — Anesthesia Preprocedure Evaluation (Addendum)
Anesthesia Evaluation  Patient identified by MRN, date of birth, ID band Patient awake    Reviewed: Allergy & Precautions, NPO status , Patient's Chart, lab work & pertinent test results  History of Anesthesia Complications (+) PONV  Airway Mallampati: I  TM Distance: >3 FB Neck ROM: Full    Dental no notable dental hx. (+) Teeth Intact, Dental Advisory Given   Pulmonary neg pulmonary ROS,    Pulmonary exam normal breath sounds clear to auscultation       Cardiovascular Normal cardiovascular exam Rhythm:Regular Rate:Normal     Neuro/Psych  Headaches,    GI/Hepatic negative GI ROS, Neg liver ROS,   Endo/Other  Hypothyroidism   Renal/GU negative Renal ROS     Musculoskeletal negative musculoskeletal ROS (+)   Abdominal   Peds  Hematology Lab Results      Component                Value               Date                      WBC                      6.5                 11/15/2020                HGB                      14.1                11/15/2020                HCT                      42.7                11/15/2020                MCV                      96.6                11/15/2020                PLT                      213                 11/15/2020              Anesthesia Other Findings   Reproductive/Obstetrics                            Anesthesia Physical Anesthesia Plan  ASA: 2  Anesthesia Plan: General   Post-op Pain Management:    Induction:   PONV Risk Score and Plan: 4 or greater and Treatment may vary due to age or medical condition, Ondansetron, Dexamethasone and Midazolam  Airway Management Planned: Oral ETT  Additional Equipment: None  Intra-op Plan:   Post-operative Plan: Extubation in OR  Informed Consent: I have reviewed the patients History and Physical, chart, labs and discussed the procedure including the risks, benefits and alternatives for the  proposed anesthesia with the patient or authorized representative  who has indicated his/her understanding and acceptance.     Dental advisory given  Plan Discussed with:   Anesthesia Plan Comments: (GA w Lidocaine infusion)       Anesthesia Quick Evaluation

## 2020-11-17 ENCOUNTER — Ambulatory Visit (HOSPITAL_BASED_OUTPATIENT_CLINIC_OR_DEPARTMENT_OTHER): Payer: BC Managed Care – PPO | Admitting: Anesthesiology

## 2020-11-17 ENCOUNTER — Encounter (HOSPITAL_BASED_OUTPATIENT_CLINIC_OR_DEPARTMENT_OTHER)
Admission: RE | Disposition: A | Discharge status: Home / Self Care | Payer: Self-pay | Source: Home / Self Care | Attending: Obstetrics

## 2020-11-17 ENCOUNTER — Encounter (HOSPITAL_BASED_OUTPATIENT_CLINIC_OR_DEPARTMENT_OTHER): Payer: Self-pay | Admitting: Obstetrics

## 2020-11-17 ENCOUNTER — Ambulatory Visit (HOSPITAL_BASED_OUTPATIENT_CLINIC_OR_DEPARTMENT_OTHER)
Admission: RE | Admit: 2020-11-17 | Discharge: 2020-11-18 | Disposition: A | Discharge status: Home / Self Care | Payer: BC Managed Care – PPO | Attending: Obstetrics | Admitting: Obstetrics

## 2020-11-17 DIAGNOSIS — N920 Excessive and frequent menstruation with regular cycle: Secondary | ICD-10-CM | POA: Diagnosis not present

## 2020-11-17 DIAGNOSIS — Z90710 Acquired absence of both cervix and uterus: Secondary | ICD-10-CM | POA: Diagnosis present

## 2020-11-17 DIAGNOSIS — Z79899 Other long term (current) drug therapy: Secondary | ICD-10-CM | POA: Insufficient documentation

## 2020-11-17 DIAGNOSIS — E039 Hypothyroidism, unspecified: Secondary | ICD-10-CM | POA: Diagnosis not present

## 2020-11-17 DIAGNOSIS — Z79890 Hormone replacement therapy (postmenopausal): Secondary | ICD-10-CM | POA: Insufficient documentation

## 2020-11-17 DIAGNOSIS — D259 Leiomyoma of uterus, unspecified: Secondary | ICD-10-CM | POA: Diagnosis present

## 2020-11-17 DIAGNOSIS — Z886 Allergy status to analgesic agent status: Secondary | ICD-10-CM | POA: Diagnosis not present

## 2020-11-17 HISTORY — PX: ROBOTIC ASSISTED LAPAROSCOPIC HYSTERECTOMY AND SALPINGECTOMY: SHX6379

## 2020-11-17 HISTORY — DX: Other specified postprocedural states: R11.2

## 2020-11-17 HISTORY — DX: Other specified postprocedural states: Z98.890

## 2020-11-17 HISTORY — DX: Allergic rhinitis, unspecified: J30.9

## 2020-11-17 HISTORY — DX: Headache, unspecified: R51.9

## 2020-11-17 LAB — TYPE AND SCREEN
ABO/RH(D): O POS
Antibody Screen: NEGATIVE

## 2020-11-17 LAB — POCT PREGNANCY, URINE: Preg Test, Ur: NEGATIVE

## 2020-11-17 SURGERY — XI ROBOTIC ASSISTED LAPAROSCOPIC HYSTERECTOMY AND SALPINGECTOMY
Anesthesia: General | Site: Abdomen | Laterality: Bilateral

## 2020-11-17 MED ORDER — MIDAZOLAM HCL 2 MG/2ML IJ SOLN
INTRAMUSCULAR | Status: AC
  Filled 2020-11-17: qty 2

## 2020-11-17 MED ORDER — ONDANSETRON HCL 4 MG/2ML IJ SOLN: 4.0000 mg | Freq: Once | INTRAMUSCULAR | Status: DC | PRN

## 2020-11-17 MED ORDER — ACETAMINOPHEN 500 MG PO TABS
ORAL_TABLET | ORAL | Status: AC
  Filled 2020-11-17: qty 2

## 2020-11-17 MED ORDER — ONDANSETRON HCL 4 MG/2ML IJ SOLN
INTRAMUSCULAR | Status: DC | PRN
  Administered 2020-11-17: 4 mg via INTRAVENOUS

## 2020-11-17 MED ORDER — CEFAZOLIN SODIUM-DEXTROSE 2-3 GM-%(50ML) IV SOLR
INTRAVENOUS | Status: DC | PRN
  Administered 2020-11-17: 2 g via INTRAVENOUS

## 2020-11-17 MED ORDER — ACETAMINOPHEN 10 MG/ML IV SOLN: 1000.0000 mg | Freq: Once | INTRAVENOUS | Status: DC | PRN

## 2020-11-17 MED ORDER — HYDROMORPHONE HCL 1 MG/ML IJ SOLN: 0.2000 mg | INTRAMUSCULAR | Status: DC | PRN

## 2020-11-17 MED ORDER — PANTOPRAZOLE SODIUM 40 MG PO TBEC
DELAYED_RELEASE_TABLET | ORAL | Status: AC
  Filled 2020-11-17: qty 1

## 2020-11-17 MED ORDER — MENTHOL 3 MG MT LOZG: 1.0000 | LOZENGE | OROMUCOSAL | Status: DC | PRN

## 2020-11-17 MED ORDER — DEXAMETHASONE SODIUM PHOSPHATE 10 MG/ML IJ SOLN
INTRAMUSCULAR | Status: DC | PRN
  Administered 2020-11-17: 10 mg via INTRAVENOUS

## 2020-11-17 MED ORDER — IBUPROFEN 200 MG PO TABS: 600.0000 mg | ORAL_TABLET | Freq: Four times a day (QID) | ORAL | Status: DC

## 2020-11-17 MED ORDER — SODIUM CHLORIDE 0.9 % IV SOLN
INTRAVENOUS | Status: DC | PRN
  Administered 2020-11-17: 60 mL

## 2020-11-17 MED ORDER — KETOROLAC TROMETHAMINE 30 MG/ML IJ SOLN: 30.0000 mg | Freq: Four times a day (QID) | INTRAMUSCULAR | Status: DC

## 2020-11-17 MED ORDER — LACTATED RINGERS IV SOLN: INTRAVENOUS | Status: DC

## 2020-11-17 MED ORDER — PROPOFOL 10 MG/ML IV BOLUS
INTRAVENOUS | Status: AC
  Filled 2020-11-17: qty 20

## 2020-11-17 MED ORDER — KETOROLAC TROMETHAMINE 30 MG/ML IJ SOLN
30.0000 mg | Freq: Four times a day (QID) | INTRAMUSCULAR | Status: DC
  Administered 2020-11-17 – 2020-11-18 (×2): 30 mg via INTRAVENOUS

## 2020-11-17 MED ORDER — HYDROMORPHONE HCL 1 MG/ML IJ SOLN: 0.2500 mg | INTRAMUSCULAR | Status: DC | PRN

## 2020-11-17 MED ORDER — ACETAMINOPHEN 500 MG PO TABS
1000.0000 mg | ORAL_TABLET | Freq: Four times a day (QID) | ORAL | Status: DC
  Administered 2020-11-17 – 2020-11-18 (×3): 1000 mg via ORAL

## 2020-11-17 MED ORDER — AMISULPRIDE (ANTIEMETIC) 5 MG/2ML IV SOLN
INTRAVENOUS | Status: AC
  Filled 2020-11-17: qty 2

## 2020-11-17 MED ORDER — FENTANYL CITRATE (PF) 100 MCG/2ML IJ SOLN
INTRAMUSCULAR | Status: DC | PRN
  Administered 2020-11-17: 50 ug via INTRAVENOUS
  Administered 2020-11-17: 100 ug via INTRAVENOUS
  Administered 2020-11-17: 50 ug via INTRAVENOUS

## 2020-11-17 MED ORDER — ONDANSETRON HCL 4 MG/2ML IJ SOLN: 4.0000 mg | Freq: Four times a day (QID) | INTRAMUSCULAR | Status: DC | PRN

## 2020-11-17 MED ORDER — KETOROLAC TROMETHAMINE 30 MG/ML IJ SOLN
INTRAMUSCULAR | Status: DC | PRN
  Administered 2020-11-17: 30 mg via INTRAVENOUS

## 2020-11-17 MED ORDER — SUGAMMADEX SODIUM 200 MG/2ML IV SOLN
INTRAVENOUS | Status: DC | PRN
  Administered 2020-11-17: 200 mg via INTRAVENOUS

## 2020-11-17 MED ORDER — LIDOCAINE HCL (PF) 2 % IJ SOLN
INTRAMUSCULAR | Status: DC | PRN
  Administered 2020-11-17: 1.5 mg/kg/h via INTRADERMAL

## 2020-11-17 MED ORDER — CEFAZOLIN SODIUM-DEXTROSE 2-4 GM/100ML-% IV SOLN
INTRAVENOUS | Status: AC
  Filled 2020-11-17: qty 100

## 2020-11-17 MED ORDER — OXYCODONE HCL 5 MG/5ML PO SOLN: 5.0000 mg | Freq: Once | ORAL | Status: DC | PRN

## 2020-11-17 MED ORDER — MIDAZOLAM HCL 5 MG/5ML IJ SOLN
INTRAMUSCULAR | Status: DC | PRN
  Administered 2020-11-17: 2 mg via INTRAVENOUS

## 2020-11-17 MED ORDER — LIDOCAINE 2% (20 MG/ML) 5 ML SYRINGE
INTRAMUSCULAR | Status: DC | PRN
  Administered 2020-11-17: 60 mg via INTRAVENOUS

## 2020-11-17 MED ORDER — AMISULPRIDE (ANTIEMETIC) 5 MG/2ML IV SOLN
10.0000 mg | Freq: Once | INTRAVENOUS | Status: AC | PRN
  Administered 2020-11-17: 10 mg via INTRAVENOUS

## 2020-11-17 MED ORDER — PANTOPRAZOLE SODIUM 40 MG PO TBEC
40.0000 mg | DELAYED_RELEASE_TABLET | Freq: Every day | ORAL | Status: DC
  Administered 2020-11-17: 40 mg via ORAL

## 2020-11-17 MED ORDER — SCOPOLAMINE 1 MG/3DAYS TD PT72
1.0000 | MEDICATED_PATCH | TRANSDERMAL | Status: DC
  Administered 2020-11-17: 1.5 mg via TRANSDERMAL

## 2020-11-17 MED ORDER — ONDANSETRON HCL 4 MG/2ML IJ SOLN
INTRAMUSCULAR | Status: AC
  Filled 2020-11-17: qty 2

## 2020-11-17 MED ORDER — BUPIVACAINE HCL (PF) 0.25 % IJ SOLN
INTRAMUSCULAR | Status: DC | PRN
  Administered 2020-11-17: 20 mL

## 2020-11-17 MED ORDER — ONDANSETRON HCL 4 MG PO TABS: 4.0000 mg | ORAL_TABLET | Freq: Four times a day (QID) | ORAL | Status: DC | PRN

## 2020-11-17 MED ORDER — OXYCODONE HCL 5 MG PO TABS: 5.0000 mg | ORAL_TABLET | Freq: Once | ORAL | Status: DC | PRN

## 2020-11-17 MED ORDER — DEXAMETHASONE SODIUM PHOSPHATE 10 MG/ML IJ SOLN
INTRAMUSCULAR | Status: AC
  Filled 2020-11-17: qty 1

## 2020-11-17 MED ORDER — SCOPOLAMINE 1 MG/3DAYS TD PT72
MEDICATED_PATCH | TRANSDERMAL | Status: AC
  Filled 2020-11-17: qty 1

## 2020-11-17 MED ORDER — POVIDONE-IODINE 10 % EX SWAB: 2.0000 "application " | Freq: Once | CUTANEOUS | Status: DC

## 2020-11-17 MED ORDER — SODIUM CHLORIDE 0.9 % IR SOLN
Status: DC | PRN
  Administered 2020-11-17: 1000 mL

## 2020-11-17 MED ORDER — FENTANYL CITRATE (PF) 250 MCG/5ML IJ SOLN
INTRAMUSCULAR | Status: AC
  Filled 2020-11-17: qty 5

## 2020-11-17 MED ORDER — ROCURONIUM BROMIDE 10 MG/ML (PF) SYRINGE
PREFILLED_SYRINGE | INTRAVENOUS | Status: DC | PRN
  Administered 2020-11-17: 20 mg via INTRAVENOUS
  Administered 2020-11-17 (×2): 10 mg via INTRAVENOUS
  Administered 2020-11-17: 40 mg via INTRAVENOUS
  Administered 2020-11-17: 20 mg via INTRAVENOUS

## 2020-11-17 MED ORDER — OXYCODONE HCL 5 MG PO TABS: 5.0000 mg | ORAL_TABLET | ORAL | Status: DC | PRN

## 2020-11-17 MED ORDER — KETOROLAC TROMETHAMINE 30 MG/ML IJ SOLN
INTRAMUSCULAR | Status: AC
  Filled 2020-11-17: qty 1

## 2020-11-17 MED ORDER — SODIUM CHLORIDE (PF) 0.9 % IJ SOLN
INTRAMUSCULAR | Status: AC
  Filled 2020-11-17: qty 10

## 2020-11-17 MED ORDER — ROCURONIUM BROMIDE 10 MG/ML (PF) SYRINGE
PREFILLED_SYRINGE | INTRAVENOUS | Status: AC
  Filled 2020-11-17: qty 10

## 2020-11-17 MED ORDER — SIMETHICONE 80 MG PO CHEW
80.0000 mg | CHEWABLE_TABLET | Freq: Four times a day (QID) | ORAL | Status: DC | PRN
  Administered 2020-11-18: 80 mg via ORAL

## 2020-11-17 MED ORDER — PROPOFOL 10 MG/ML IV BOLUS
INTRAVENOUS | Status: DC | PRN
  Administered 2020-11-17: 130 mg via INTRAVENOUS

## 2020-11-17 SURGICAL SUPPLY — 66 items
ADH SKN CLS APL DERMABOND .7 (GAUZE/BANDAGES/DRESSINGS) ×1
APL PRP STRL LF DISP 70% ISPRP (MISCELLANEOUS) ×1
BARRIER ADHS 3X4 INTERCEED (GAUZE/BANDAGES/DRESSINGS) IMPLANT
BRR ADH 4X3 ABS CNTRL BYND (GAUZE/BANDAGES/DRESSINGS)
CHLORAPREP W/TINT 26 (MISCELLANEOUS) ×2 IMPLANT
CNTNR URN SCR LID CUP LEK RST (MISCELLANEOUS) ×1 IMPLANT
CONT SPEC 4OZ STRL OR WHT (MISCELLANEOUS) ×2
COVER BACK TABLE 60X90IN (DRAPES) ×2 IMPLANT
COVER LIGHT HANDLE UNIVERSAL (MISCELLANEOUS) ×2 IMPLANT
COVER TIP SHEARS 8 DVNC (MISCELLANEOUS) ×1 IMPLANT
COVER TIP SHEARS 8MM DA VINCI (MISCELLANEOUS) ×1
DECANTER SPIKE VIAL GLASS SM (MISCELLANEOUS) ×4 IMPLANT
DEFOGGER SCOPE WARMER CLEARIFY (MISCELLANEOUS) ×2 IMPLANT
DERMABOND ADVANCED (GAUZE/BANDAGES/DRESSINGS) ×1
DERMABOND ADVANCED .7 DNX12 (GAUZE/BANDAGES/DRESSINGS) ×1 IMPLANT
DRAPE ARM DVNC X/XI (DISPOSABLE) ×4 IMPLANT
DRAPE COLUMN DVNC XI (DISPOSABLE) ×1 IMPLANT
DRAPE DA VINCI XI ARM (DISPOSABLE) ×4
DRAPE DA VINCI XI COLUMN (DISPOSABLE) ×1
DRAPE UTILITY XL STRL (DRAPES) ×2 IMPLANT
ELECT REM PT RETURN 9FT ADLT (ELECTROSURGICAL) ×2
ELECTRODE REM PT RTRN 9FT ADLT (ELECTROSURGICAL) ×1 IMPLANT
GAUZE 4X4 16PLY ~~LOC~~+RFID DBL (SPONGE) ×4 IMPLANT
GLOVE SURG ENC MOIS LTX SZ6.5 (GLOVE) ×6 IMPLANT
GLOVE SURG UNDER POLY LF SZ7 (GLOVE) ×10 IMPLANT
HOLDER FOLEY CATH W/STRAP (MISCELLANEOUS) IMPLANT
IRRIG SUCT STRYKERFLOW 2 WTIP (MISCELLANEOUS) ×2
IRRIGATION SUCT STRKRFLW 2 WTP (MISCELLANEOUS) ×1 IMPLANT
IV NS 1000ML (IV SOLUTION) ×4
IV NS 1000ML BAXH (IV SOLUTION) ×2 IMPLANT
IV SET EXTENSION GRAVITY 40 LF (IV SETS) ×2 IMPLANT
KIT TURNOVER CYSTO (KITS) ×2 IMPLANT
LEGGING LITHOTOMY PAIR STRL (DRAPES) ×2 IMPLANT
OBTURATOR OPTICAL STANDARD 8MM (TROCAR) ×1
OBTURATOR OPTICAL STND 8 DVNC (TROCAR) ×1
OBTURATOR OPTICALSTD 8 DVNC (TROCAR) ×1 IMPLANT
OCCLUDER COLPOPNEUMO (BALLOONS) ×2 IMPLANT
PACK ROBOT WH (CUSTOM PROCEDURE TRAY) ×2 IMPLANT
PACK ROBOTIC GOWN (GOWN DISPOSABLE) ×2 IMPLANT
PACK TRENDGUARD 450 HYBRID PRO (MISCELLANEOUS) IMPLANT
PAD OB MATERNITY 4.3X12.25 (PERSONAL CARE ITEMS) ×2 IMPLANT
PAD PREP 24X48 CUFFED NSTRL (MISCELLANEOUS) ×2 IMPLANT
PROTECTOR NERVE ULNAR (MISCELLANEOUS) ×4 IMPLANT
SEAL CANN UNIV 5-8 DVNC XI (MISCELLANEOUS) ×4 IMPLANT
SEAL XI 5MM-8MM UNIVERSAL (MISCELLANEOUS) ×4
SEALER VESSEL DA VINCI XI (MISCELLANEOUS) ×1
SEALER VESSEL EXT DVNC XI (MISCELLANEOUS) ×1 IMPLANT
SET TRI-LUMEN FLTR TB AIRSEAL (TUBING) ×2 IMPLANT
SPONGE T-LAP 4X18 ~~LOC~~+RFID (SPONGE) ×2 IMPLANT
SUT VIC AB 0 CT1 27 (SUTURE) ×6
SUT VIC AB 0 CT1 27XBRD ANBCTR (SUTURE) ×3 IMPLANT
SUT VIC AB 3-0 CT1 27 (SUTURE) ×2
SUT VIC AB 3-0 CT1 TAPERPNT 27 (SUTURE) ×1 IMPLANT
SUT VIC AB 4-0 PS2 27 (SUTURE) ×4 IMPLANT
SUT VICRYL 0 UR6 27IN ABS (SUTURE) ×2 IMPLANT
SUT VLOC 180 0 9IN  GS21 (SUTURE) ×1
SUT VLOC 180 0 9IN GS21 (SUTURE) ×1 IMPLANT
TIP RUMI ORANGE 6.7MMX12CM (TIP) IMPLANT
TIP UTERINE 5.1X6CM LAV DISP (MISCELLANEOUS) IMPLANT
TIP UTERINE 6.7X10CM GRN DISP (MISCELLANEOUS) ×2 IMPLANT
TIP UTERINE 6.7X6CM WHT DISP (MISCELLANEOUS) IMPLANT
TIP UTERINE 6.7X8CM BLUE DISP (MISCELLANEOUS) IMPLANT
TOWEL OR 17X26 10 PK STRL BLUE (TOWEL DISPOSABLE) ×2 IMPLANT
TRAY FOLEY W/BAG SLVR 14FR LF (SET/KITS/TRAYS/PACK) ×2 IMPLANT
TRENDGUARD 450 HYBRID PRO PACK (MISCELLANEOUS)
TROCAR PORT AIRSEAL 5X120 (TROCAR) ×2 IMPLANT

## 2020-11-17 NOTE — Op Note (Addendum)
11/17/2020  4:53 PM  PATIENT:  Dana Joseph  50 y.o. female  PRE-OPERATIVE DIAGNOSIS:  Fibroids, Menorrhagia  POST-OPERATIVE DIAGNOSIS:  Fibroids, Menorrhagia, vesicouterine adhesions  PROCEDURE:  Procedure(s) with comments: XI ROBOTIC ASSISTED LAPAROSCOPIC HYSTERECTOMY AND SALPINGECTOMY (Bilateral) - Requests 3hrs. Tracie to RNFA Lysis of adhesions  SURGEON:  Surgeon(s) and Role:    * Aloha Gell, MD - Primary    * Law, Cassandra A, DO - Assisting     RFNA  PHYSICIAN ASSISTANT:   ASSISTANTS: As above  ANESTHESIA:   general  EBL:  50 mL   BLOOD ADMINISTERED:none  DRAINS: Urinary Catheter (Foley)   LOCAL MEDICATIONS USED:  MARCAINE    and BUPIVICAINE   SPECIMEN: Uterus with multiple fibroids, bilateral fallopian tubes, cervix  DISPOSITION OF SPECIMEN:  PATHOLOGY  COUNTS:  YES  TOURNIQUET:  * No tourniquets in log *  DICTATION: .Note written in EPIC  PLAN OF CARE: Admit for overnight observation  PATIENT DISPOSITION:  PACU - hemodynamically stable.   Delay start of Pharmacological VTE agent (>24hrs) due to surgical blood loss or risk of bleeding: yes  Procedure:Robotic-assisted total laparoscopic hysterectomy with bilateral salpingectomy and ovarian retention, lysis of adhesions Complications: none Antibiotics: 2 g Ancef Findings: Enlarged broad-based uterus with multiple fibroids filling the posterior cul-de-sac, dense adhesions of the anterior abdominal wall to uterus and bladder, b/l ovaries and tubes, absent  gallbladder, nl liver edge with mottled appearance of the liver, appendix not visualized, Hemostasis post-procedure with peristalsis of bilateral ureters.   Indications: This is a 50 year old G4, P2 with long history of menorrhagia and fibroids.  Patient failed hormonal therapy.  She continued to display despite hysteroscopic myomectomy and eventually decided on definitive therapy with hysterectomy..   Procedure: After informed consent obtained, the  patient was taken to the operating room where general anesthesia was initiated without difficulty.   She was prepped and draped in normal sterile fashion in the dorsal supine lithotomy position. A Foley catheter was inserted sterilely into the bladder. A bimanual examination was done to assess the size and position of the uterus. A weighted speculum was placed in the vagina and deaver retractors were used on the anterior vaginal wall. .The cervix was grasped with tenaculum. Cervix was dilated and sounded. The cervix was assessed to identify the Rumi-Co size.  A small cup and an 10 cm shaft was used. The uterine balloon was inflated.  Gloved were changed. Attention was then turned to the patient's abdomen. 0.5 % marcaine was used prior to all incision. A total of 10 cc of marcaine was used.  A 10 mm incision was made several centimeters above the umbilicus over the prior scar from her previous laparoscopy. Blunt and sharp dissection done until the fascia was identified. This was then grasped with Kocher clamps x2 and entered sharply. A pursestring suture of 0 Vicryl was then placed along the incision and a non-bladed Sheryle Hail was inserted into the peritoneal cavity. Intraperitoneal placement was confirmed with the use of the camera and pneumoperitoneum was created to 15 mm of mercury. The pursestring suture was secured around the port and pneumoperitoneum was maintained. Brief survey of the abdomen and pelvis was done with findings as above. The abdominal wall was assessed and additional port sites were marked. 8 mm incisions were placed in the right (two) and left lower quadrants (2) and non-bladed ports were placed under direct visualization. The robot was brought to the patient's side and attached with the right side docking. The robotic instruments  were placed under direct visualization until proper placement just over the uterus.   I then went to the robotic console. Brief survey of the patient's abdomen and  pelvis revealed  findings as above. Bilateral tubes and ovaries were normal.  Bilateral ureters were identified.  A single large adhesion was noted from the anterior abdominal wall to the anterior surface of the uterus.  Initial attempt to take this down quickly revealed bladder adhesed in this complex.    I began on the left side: the  Left ureter and  IP were identified. The left fallopian tube was serially cauterized and transected along the mesosalpynx with the vessel sealer. The left round ligament was cauterized and divided.  The left uterine-ovarian ligament was sealed and transected. The anterior leaf of the broad ligament was dissected bluntly then monopolar cautery was used to separate the anterior and posterior leafs of the broad ligament.  At this point I began dissecting the bladder from the anterior adhesion, slow and cautious and took greater than 30 minutes.  The bladder had to be backfilled to better identify the edges of the bladder.  An initial bladder flap was created though this was further dissected out later in the case.   Attention was then turned to the patient's  Right side: the right fallopian tube was serially cauterized and transected along the mesosalpynx.  The right round  ligament was cauterized and transected. The right uterine-ovarian ligament was sealed and cauterized. The anterior and posterior leaves of the broad ligament were bluntly and sharply dissected apart from each other.  Again additional dissection on the bladder adhesion was done.  Monopolar cautery was used to come across the anterior leaf of the right broad ligament.  I then spent additional time dissecting the bladder off of the cervix.  This was mostly done with blunt dissection and focal cautery when needed.  The left uterine artery was serially cauterized with the vessel sealer and transected.  The right uterine artery and then serially cauterized with the vessel sealer and transected. The uterus was placed  on stretch to allow better visualization of the arteries. The bladder flap was further taken down both sharply and with cautery. Cautery was used for hemostasis on several places along the bladder flap. At this point with the pressure inward on the Rumi, the anterior colpotomy was made with the monopolar scissors. This was carried around to the patient's right side. Additional bipolar cautery was used on the  both angles of the cuff- this was carried out with the vessel sealer. The uterus was then positioned  anteriorly  to allow easy access to the posterior  colpotomy. This was carried out with the monopolar scissors.  Good hemostasis was noted along the angles of the incision.  Given the size of the uterus, the number of firm fibroids in the broad base of the uterus it was clear the uterus was not going to deliver easily from the vagina.  I scrubbed and went down to the vagina and attempted to remove the specimen.  As expected this did not come.  Using tenaculums and rotating the uterus in multiple directions I was unable to deliver the uterus.  Knowing that the patient had a large posterior fibroid I made a plan to remove the fibroid from the posterior uterus.  Using retractors in the vagina I used a scalpel to incise the posterior uterine wall.  Using Kura clamps to help separate the fibroid from the myometrium and dissection with my  finger as well as traction with serial tenaculums after about 30 minutes I was eventually able to deliver the fibroid on the back of the uterus.  The fibroids still remained attached to the uterus but this caused significant debulking of the uterus.  The uterus was then able to be manipulated out through the vagina.  A small vaginal laceration was noted and this was closed with 3-0 Vicryl in a running fashion.  It was hemostatic  Irrigation was used and the vaginal cuff appeared hemostatic. A 9 inch V-lock suture was placed though the umbilical port. Instruments were changed to  allow a suture cut needle driver through the #1 port and and a long-tipped forceps through the #2 port. The right angle was closed and locked with the V-lock suture. Running suture was carried out tothe L angle.  A more superficial layer of suture was placed travelling back to the right angle. The suture was cut and removed. Excellent hemostasis was noted. Suction irrigation was carried out and hemostasis was assured along the vaginal cuff. The pedicles were reassessed also found to be hemostatic. All free fluid was removed from the abdomen. The robotic portion was completed. The robot was undocked.   By standard laparoscopy the pelvis was irrigated and evaluated. The patient was placed in reverse Trendelenburg, all additional fluid was suctioned from the abdomen and pelvis the cuff was reinspected and  found to be hemostatic. All pedicles were also found to be hemostatic. 20 CC 1/4% bupivicaine was placed into the peritoneal cavity.  Under direct visualization the ports were removed. Pneumoperitoneum was released and the umbilical port was removed.  The  pursestring suture at the umbilicus was then closed. No additional fascial incisions were closed due to the small size and the nondilated nature of these incisions. A 4-0 Vicryl was used to close the additional laparoscopic ports sites. Good hemostasis was noted.  Sponge lap and needle counts were correct x3 the patient was woken from general anesthesia having tolerated the procedure well and taken to the recovery room in a stable fashion.   Of note surgery was longer than usual given the size of the uterus at 490 g with large broad fibroids requiring debulking in order to achieve vaginal delivery of the uterus.  Surgical time approximately 3 and half hours for same usual 2-hour surgery.  Dana Joseph 11/18/2020 8:25 AM

## 2020-11-17 NOTE — Brief Op Note (Signed)
11/17/2020  4:53 PM  PATIENT:  Dana Joseph  50 y.o. female  PRE-OPERATIVE DIAGNOSIS:  Fibroids, Menorrhagia  POST-OPERATIVE DIAGNOSIS:  Fibroids, Menorrhagia, vesicouterine adhesions  PROCEDURE:  Procedure(s) with comments: XI ROBOTIC ASSISTED LAPAROSCOPIC HYSTERECTOMY AND SALPINGECTOMY (Bilateral) - Requests 3hrs. Tracie to RNFA Lysis of adhesions  SURGEON:  Surgeon(s) and Role:    * Aloha Gell, MD - Primary    * Law, Cassandra A, DO - Assisting     RFNA  PHYSICIAN ASSISTANT:   ASSISTANTS: As above  ANESTHESIA:   general  EBL:  50 mL   BLOOD ADMINISTERED:none  DRAINS: Urinary Catheter (Foley)   LOCAL MEDICATIONS USED:  MARCAINE    and BUPIVICAINE   SPECIMEN: Uterus with multiple fibroids, bilateral fallopian tubes, cervix  DISPOSITION OF SPECIMEN:  PATHOLOGY  COUNTS:  YES  TOURNIQUET:  * No tourniquets in log *  DICTATION: .Note written in EPIC  PLAN OF CARE: Admit for overnight observation  PATIENT DISPOSITION:  PACU - hemodynamically stable.   Delay start of Pharmacological VTE agent (>24hrs) due to surgical blood loss or risk of bleeding: yes

## 2020-11-17 NOTE — Transfer of Care (Signed)
Immediate Anesthesia Transfer of Care Note  Patient: Dana Joseph  Procedure(s) Performed: XI ROBOTIC ASSISTED LAPAROSCOPIC HYSTERECTOMY AND SALPINGECTOMY (Bilateral: Abdomen)  Patient Location: PACU  Anesthesia Type:General  Level of Consciousness: awake, alert , oriented and patient cooperative  Airway & Oxygen Therapy: Patient Spontanous Breathing and Patient connected to face mask oxygen  Post-op Assessment: Report given to RN and Post -op Vital signs reviewed and stable  Post vital signs: Reviewed and stable  Last Vitals:  Vitals Value Taken Time  BP 124/73 11/17/20 1703  Temp    Pulse 74 11/17/20 1709  Resp 14 11/17/20 1709  SpO2 100 % 11/17/20 1709  Vitals shown include unvalidated device data.  Last Pain:  Vitals:   11/17/20 1129  TempSrc: Oral  PainSc: 0-No pain      Patients Stated Pain Goal: 5 (03/28/41 8887)  Complications: No notable events documented.

## 2020-11-17 NOTE — H&P (Signed)
Chief complaint: Menorrhagia and fibroids  History of present illness: 50 year old G4 P2-0-2-2 here for planned robotic assisted total laparoscopic hysterectomy with bilateral salpingectomy and ovarian retention.  Patient with history of anemia though recent hemoglobin has been improved with oral iron.  Patient with longstanding history of menorrhagia.  Patient with prior hysteroscopic myomectomy without postop hormonal suppression and recurrence of fibroids and menorrhagia post op.  Decision has been made for definitive hysterectomy  Patient with hypothyroidism currently on 112 mcg of Synthroid.  Patient with normal Pap HPV -721.  Patient with normal endometrial biopsy September 2022 normal with progestin effect.  Patient with several recent ultrasounds most recently 10/12/2020 uterus 11.5 x 10.2 x 10.2 with trace free fluid in the endometrium thin endometrial walls, normal bilateral ovaries.  Multiple fibroids largest right subserosal 7.6 x 6.2 x 7.5, posterior fundal 3.8 x 3.6 x 3.5, midline anterior 3 x 3.6 x 3.1 and left subserosal 4.8 x 4.3 x 4.5 cm.  Of note this ultrasound was done about 3 months into Lupron with no significant decrease in size.   Past Medical History:  Diagnosis Date   Allergic rhinitis    Anemia    Colon polyps    Complication of anesthesia 2016   trouble swallowing after 06-2014 total thyroidectomy for a few days, issue resolved   Dysrhythmia    heart palpitations   Headache    Hypothyroidism    PONV (postoperative nausea and vomiting)    will wear scopolamine patch but wants to take off after a few hours   Thyroid    06-2014   Wears glasses 11/09/2020   reading only   Past Surgical History:  Procedure Laterality Date   BRAVO [facility] STUDY N/A 07/29/2013   Procedure: BRAVO [place];  Surgeon: Arta Silence, MD;  Location: [facility];  Service: Endoscopy;  Laterality: N/A;   CESAREAN SECTION  2003   CHOLECYSTECTOMY  2010   laparoscopic   COLONOSCOPY  2020   dr  Levonne Hubert [facility]   DEEP NECK LYMPH NODE BIOPSY / EXCISION  1998   DILATATION & CURETTAGE/HYSTEROSCOPY WITH MYOSURE N/A 03/21/2017   Procedure: DILATATION & CURETTAGE/HYSTEROSCOPY WITH MYOSURE;  Surgeon: Aloha Gell, MD;  Location: [facility];  Service: Gynecology;  Laterality: N/A;   DILATION AND CURETTAGE OF UTERUS     ESOPHAGEAL MANOMETRY N/A 07/13/2013   Procedure: ESOPHAGEAL MANOMETRY (EM);  Surgeon: Arta Silence, MD;  Location: [facility];  Service: Endoscopy;  Laterality: N/A;  Patient will arrive @ 1015 to pre op for procedure until time for mano.    ESOPHAGOGASTRODUODENOSCOPY (EGD) WITH PROPOFOL N/A 07/29/2013   Procedure: ESOPHAGOGASTRODUODENOSCOPY (EGD) WITH PROPOFOL;  Surgeon: Arta Silence, MD;  Location: [facility];  Service: Endoscopy;  Laterality: N/A;   LYMPH NODE BIOPSY  1998   THYROIDECTOMY  06/2014   total   WISDOM TOOTH EXTRACTION  1990   Allergies: Aspirin Medications: Synthroid  Physical exam: Vitals:   11/17/20 1129  BP: 124/74  Pulse: 62  Resp: 16  Temp: 98.8 F (37.1 C)  SpO2: 100%   General: Well appearing, no distress Abdomen: Soft, nontender, enlarged uterus GU deferred to the OR Lower extremity: Nontender, no edema  UPT negative CBC    Component Value Date/Time   WBC 6.5 11/15/2020 1201   RBC 4.42 11/15/2020 1201   HGB 14.1 11/15/2020 1201   HCT 42.7 11/15/2020 1201   PLT 213 11/15/2020 1201   MCV 96.6 11/15/2020 1201   MCH 31.9 11/15/2020 1201   MCHC  33.0 11/15/2020 1201   RDW 12.9 11/15/2020 1201   LYMPHSABS 2.4 11/29/2007 2210   MONOABS 0.4 12/16/2007 0822   EOSABS 0.1 12/16/2007 0822   BASOSABS 0.0 12/16/2007 7209   Normal basic metabolic profile  Assessment plan: 50 year old G4, P2 with enlarged fibroid uterus nonresponsive to hormonal medicine in the setting of history of anemia now corrected with iron.  Patient for definitive hysterectomy.  We will plan robotic attempted total laparoscopic hysterectomy with ovarian  retention.  Patient understands uterus is at the upper limits for laparoscopic hysterectomy there is a possible need for sharp morcellation at the vagina for removal of the uterus by bivalving the specimen.  We discussed we would not plan any power morcellation intra abdominally.  Patient also is aware of the risks of needing open surgery to complete the procedure.  Patient consents to blood if needed.  MERCADEZ HEITMAN 11/17/2020 1:11 PM

## 2020-11-17 NOTE — Anesthesia Procedure Notes (Signed)
Procedure Name: Intubation Date/Time: 11/17/2020 1:22 PM Performed by: Rogers Blocker, CRNA Pre-anesthesia Checklist: Patient identified, Emergency Drugs available, Suction available and Patient being monitored Patient Re-evaluated:Patient Re-evaluated prior to induction Oxygen Delivery Method: Circle System Utilized Preoxygenation: Pre-oxygenation with 100% oxygen Induction Type: IV induction Ventilation: Mask ventilation without difficulty Laryngoscope Size: Mac and 3 Grade View: Grade I Tube type: Oral Number of attempts: 1 Airway Equipment and Method: Stylet Placement Confirmation: ETT inserted through vocal cords under direct vision, positive ETCO2 and breath sounds checked- equal and bilateral Secured at: 22 cm Tube secured with: Tape Dental Injury: Teeth and Oropharynx as per pre-operative assessment

## 2020-11-18 ENCOUNTER — Encounter (HOSPITAL_BASED_OUTPATIENT_CLINIC_OR_DEPARTMENT_OTHER): Payer: Self-pay | Admitting: Obstetrics

## 2020-11-18 DIAGNOSIS — D259 Leiomyoma of uterus, unspecified: Secondary | ICD-10-CM | POA: Diagnosis not present

## 2020-11-18 LAB — CBC
HCT: 38.3 % (ref 36.0–46.0)
Hemoglobin: 13.1 g/dL (ref 12.0–15.0)
MCH: 32.3 pg (ref 26.0–34.0)
MCHC: 34.2 g/dL (ref 30.0–36.0)
MCV: 94.6 fL (ref 80.0–100.0)
Platelets: 181 10*3/uL (ref 150–400)
RBC: 4.05 MIL/uL (ref 3.87–5.11)
RDW: 12.6 % (ref 11.5–15.5)
WBC: 11.6 10*3/uL — ABNORMAL HIGH (ref 4.0–10.5)
nRBC: 0 % (ref 0.0–0.2)

## 2020-11-18 MED ORDER — IBUPROFEN 600 MG PO TABS: 600.0000 mg | ORAL_TABLET | Freq: Four times a day (QID) | ORAL | 1 refills | Status: DC | PRN

## 2020-11-18 MED ORDER — KETOROLAC TROMETHAMINE 30 MG/ML IJ SOLN
INTRAMUSCULAR | Status: AC
  Filled 2020-11-18: qty 1

## 2020-11-18 MED ORDER — OXYCODONE HCL 5 MG PO TABS: 5.0000 mg | ORAL_TABLET | ORAL | 0 refills | Status: DC | PRN

## 2020-11-18 MED ORDER — SIMETHICONE 80 MG PO CHEW
CHEWABLE_TABLET | ORAL | Status: AC
  Filled 2020-11-18: qty 1

## 2020-11-18 MED ORDER — ACETAMINOPHEN 500 MG PO TABS
ORAL_TABLET | ORAL | Status: AC
  Filled 2020-11-18: qty 2

## 2020-11-18 MED ORDER — ACETAMINOPHEN 500 MG PO TABS: 1000.0000 mg | ORAL_TABLET | Freq: Four times a day (QID) | ORAL | 1 refills | Status: DC

## 2020-11-18 NOTE — Discharge Summary (Signed)
Dana Joseph MRN: [id number] DOB/AGE: April 25, 1970 50 y.o.  Admit date: 11/17/2020 Discharge date: November 18, 2020  Admission Diagnoses: Uterine fibroid [D25.9] S/P hysterectomy [Z90.710]  Discharge Diagnoses: Uterine fibroid [D25.9] S/P hysterectomy [Z90.710]        Active Problems:   Uterine fibroid   S/P hysterectomy   Discharged Condition: good  Hospital Course: Patient admitted for planned robotic assisted total laparoscopic hysterectomy with bilateral salpingectomy and ovarian retention.  Surgery went as planned.  Surgery was slightly longer than expected due to adhesions of the bladder to the uterus and the uterus and the anterior abdominal wall and the size of the uterus which required debulking of the fibroids prior to vaginal removal.  Overnight patient did well.  She did not require any narcotics.  Bleeding was minimal.  She had passed gas and tolerated small p.o.  Pain was controlled.  She had ambulated but has yet to void this morning.  Consults: None  Treatments: surgery: Robotic assisted total laparoscopic hysterectomy with bilateral salpingectomy  Disposition: Discharge disposition: 01-Home or Self Care        Allergies as of 11/18/2020       Reactions   Aspirin Itching   "mouth was itching"        Medication List     TAKE these medications    acetaminophen 500 MG tablet Commonly known as: TYLENOL Take 2 tablets (1,000 mg total) by mouth every 6 (six) hours.   Calcium Carbonate+Vitamin D 600-200 MG-UNIT Tabs Take by mouth.   ibuprofen 600 MG tablet Commonly known as: ADVIL Take 1 tablet (600 mg total) by mouth every 6 (six) hours as needed.   Iron 325 (65 Fe) MG Tabs Take by mouth daily. Stopped 11-06-2020 per dr Pamala Hurry instructions   levothyroxine 112 MCG tablet Commonly known as: SYNTHROID Take 112 mcg by mouth daily before breakfast.   Magnesium 250 MG Tabs Take by mouth.   NASACORT ALLERGY 24HR NA Place into the nose daily.    oxyCODONE 5 MG immediate release tablet Commonly known as: Oxy IR/ROXICODONE Take 1-2 tablets (5-10 mg total) by mouth every 4 (four) hours as needed for moderate pain.   PROBIOTIC PO Take by mouth daily.         Signed: Ala Dach, MD 11/18/2020, 8:23 AM

## 2020-11-21 LAB — SURGICAL PATHOLOGY

## 2020-11-26 NOTE — Anesthesia Postprocedure Evaluation (Signed)
Anesthesia Post Note  Patient: Dana Joseph  Procedure(s) Performed: XI ROBOTIC ASSISTED LAPAROSCOPIC HYSTERECTOMY AND SALPINGECTOMY (Bilateral: Abdomen)     Patient location during evaluation: PACU Anesthesia Type: General Level of consciousness: awake and alert Pain management: pain level controlled Vital Signs Assessment: post-procedure vital signs reviewed and stable Respiratory status: spontaneous breathing, nonlabored ventilation, respiratory function stable and patient connected to nasal cannula oxygen Cardiovascular status: blood pressure returned to baseline and stable Postop Assessment: no apparent nausea or vomiting Anesthetic complications: no   No notable events documented.  Last Vitals:  Vitals:   11/18/20 0530 11/18/20 0838  BP: 101/67 103/66  Pulse: (!) 59 65  Resp: 16 13  Temp: 36.9 C 36.9 C  SpO2: 97% 98%    Last Pain:  Vitals:   11/21/20 0923  TempSrc:   PainSc: 0-No pain                 Armetta Henri

## 2020-11-29 NOTE — Addendum Note (Signed)
Addendum  created 11/29/20 1018 by Janeece Riggers, MD   [place] recorded in [place], [place] filed

## 2021-08-01 ENCOUNTER — Other Ambulatory Visit: Payer: Self-pay | Admitting: Family Medicine

## 2021-08-01 DIAGNOSIS — Z1231 Encounter for screening mammogram for malignant neoplasm of breast: Secondary | ICD-10-CM

## 2021-09-14 ENCOUNTER — Ambulatory Visit
Admission: RE | Admit: 2021-09-14 | Discharge: 2021-09-14 | Disposition: A | Discharge status: Home / Self Care | Payer: BC Managed Care – PPO | Source: Ambulatory Visit

## 2021-09-14 DIAGNOSIS — Z1231 Encounter for screening mammogram for malignant neoplasm of breast: Secondary | ICD-10-CM

## 2022-08-03 ENCOUNTER — Other Ambulatory Visit: Payer: Self-pay

## 2022-08-03 DIAGNOSIS — Z1231 Encounter for screening mammogram for malignant neoplasm of breast: Secondary | ICD-10-CM

## 2022-09-18 ENCOUNTER — Ambulatory Visit
Admission: RE | Admit: 2022-09-18 | Discharge: 2022-09-18 | Disposition: A | Discharge status: Home / Self Care | Payer: BC Managed Care – PPO | Source: Ambulatory Visit

## 2022-09-18 DIAGNOSIS — Z1231 Encounter for screening mammogram for malignant neoplasm of breast: Secondary | ICD-10-CM

## 2022-09-21 ENCOUNTER — Other Ambulatory Visit: Payer: Self-pay | Admitting: Family Medicine

## 2022-09-21 ENCOUNTER — Encounter: Payer: Self-pay | Admitting: Family Medicine

## 2022-09-21 DIAGNOSIS — R928 Other abnormal and inconclusive findings on diagnostic imaging of breast: Secondary | ICD-10-CM

## 2022-09-27 ENCOUNTER — Other Ambulatory Visit: Payer: Self-pay | Admitting: Family Medicine

## 2022-09-27 ENCOUNTER — Ambulatory Visit
Admission: RE | Admit: 2022-09-27 | Discharge: 2022-09-27 | Disposition: A | Discharge status: Home / Self Care | Payer: BC Managed Care – PPO | Source: Ambulatory Visit | Attending: Family Medicine | Admitting: Family Medicine

## 2022-09-27 DIAGNOSIS — R921 Mammographic calcification found on diagnostic imaging of breast: Secondary | ICD-10-CM

## 2022-09-27 DIAGNOSIS — R928 Other abnormal and inconclusive findings on diagnostic imaging of breast: Secondary | ICD-10-CM

## 2022-10-04 ENCOUNTER — Ambulatory Visit
Admission: RE | Admit: 2022-10-04 | Discharge: 2022-10-04 | Disposition: A | Discharge status: Home / Self Care | Payer: BC Managed Care – PPO | Source: Ambulatory Visit | Attending: Family Medicine | Admitting: Family Medicine

## 2022-10-04 DIAGNOSIS — R921 Mammographic calcification found on diagnostic imaging of breast: Secondary | ICD-10-CM

## 2022-10-04 HISTORY — PX: BREAST BIOPSY: SHX20

## 2022-11-02 ENCOUNTER — Other Ambulatory Visit: Payer: Self-pay | Admitting: Oncology

## 2022-11-02 DIAGNOSIS — Z006 Encounter for examination for normal comparison and control in clinical research program: Secondary | ICD-10-CM

## 2023-01-03 ENCOUNTER — Other Ambulatory Visit (HOSPITAL_COMMUNITY)
Admission: RE | Admit: 2023-01-03 | Discharge: 2023-01-03 | Disposition: A | Discharge status: Home / Self Care | Payer: Self-pay | Source: Ambulatory Visit | Attending: Oncology | Admitting: Oncology

## 2023-01-03 DIAGNOSIS — Z006 Encounter for examination for normal comparison and control in clinical research program: Secondary | ICD-10-CM | POA: Insufficient documentation

## 2023-01-15 LAB — GENECONNECT MOLECULAR SCREEN: Genetic Analysis Overall Interpretation: NEGATIVE

## 2023-08-01 ENCOUNTER — Other Ambulatory Visit: Payer: Self-pay

## 2023-08-01 ENCOUNTER — Encounter: Payer: Self-pay | Admitting: Physical Therapy

## 2023-08-01 ENCOUNTER — Ambulatory Visit: Attending: Obstetrics | Admitting: Physical Therapy

## 2023-08-01 DIAGNOSIS — M6281 Muscle weakness (generalized): Secondary | ICD-10-CM | POA: Diagnosis not present

## 2023-08-01 DIAGNOSIS — R278 Other lack of coordination: Secondary | ICD-10-CM | POA: Diagnosis not present

## 2023-08-01 DIAGNOSIS — N816 Rectocele: Secondary | ICD-10-CM | POA: Diagnosis present

## 2023-08-01 NOTE — Therapy (Signed)
 OUTPATIENT PHYSICAL THERAPY FEMALE PELVIC EVALUATION   Patient Name: Dana Joseph MRN: [id number] DOB:[birth year], 53 y.o., female Today's Date: 08/01/2023  END OF SESSION:  PT End of Session - 08/01/23 1722     Visit Number 1    Authorization Type [id number]  NO AUTHOR REQ    PT Start Time 1142    PT Stop Time 1230    PT Time Calculation (min) 48 min    Activity Tolerance Patient tolerated treatment well    Behavior During Therapy WFL for tasks assessed/performed             Past Medical History:  Diagnosis Date   Allergic rhinitis    Anemia    Colon polyps    Complication of anesthesia 2016   trouble swallowing after 06-2014 total thyroidectomy for a few days, issue resolved   Dysrhythmia    heart palpitations   Headache    Hypothyroidism    PONV (postoperative nausea and vomiting)    will wear scopolamine  patch but wants to take off after a few hours   Thyroid     06-2014   Wears glasses 11/09/2020   reading only   Past Surgical History:  Procedure Laterality Date   BRAVO [facility] STUDY N/A 07/29/2013   Procedure: BRAVO [facility] STUDY;  Surgeon: Evangeline Hilts, MD;  Location: [facility];  Service: Endoscopy;  Laterality: N/A;   BREAST BIOPSY Right 10/04/2022   MM RT BREAST BX W LOC DEV 1ST LESION IMAGE BX SPEC STEREO GUIDE 10/04/2022 [facility]   CESAREAN SECTION  2003   CHOLECYSTECTOMY  2010   laparoscopic   COLONOSCOPY  2020   dr Garrie Kand [facility]   DEEP NECK LYMPH NODE BIOPSY / EXCISION  1998   DILATATION & CURETTAGE/HYSTEROSCOPY WITH MYOSURE N/A 03/21/2017   Procedure: DILATATION & CURETTAGE/HYSTEROSCOPY WITH MYOSURE;  Surgeon: Audelia Leaks, MD;  Location: [facility];  Service: Gynecology;  Laterality: N/A;   DILATION AND CURETTAGE OF UTERUS     ESOPHAGEAL MANOMETRY N/A 07/13/2013   Procedure: ESOPHAGEAL MANOMETRY (EM);  Surgeon: Evangeline Hilts, MD;  Location: [facility];  Service: Endoscopy;  Laterality: N/A;  Patient will arrive @ 1015 to pre op for  procedure until time for mano.    ESOPHAGOGASTRODUODENOSCOPY (EGD) WITH PROPOFOL  N/A 07/29/2013   Procedure: ESOPHAGOGASTRODUODENOSCOPY (EGD) WITH PROPOFOL ;  Surgeon: Evangeline Hilts, MD;  Location: [facility];  Service: Endoscopy;  Laterality: N/A;   LYMPH NODE BIOPSY  1998   ROBOTIC ASSISTED LAPAROSCOPIC HYSTERECTOMY AND SALPINGECTOMY Bilateral 11/17/2020   Procedure: XI ROBOTIC ASSISTED LAPAROSCOPIC HYSTERECTOMY AND SALPINGECTOMY;  Surgeon: Audelia Leaks, MD;  Location: [facility];  Service: Gynecology;  Laterality: Bilateral;  Requests 3hrs. Tracie to RNFA   THYROIDECTOMY  06/2014   total   WISDOM TOOTH EXTRACTION  1990   Patient Active Problem List   Diagnosis Date Noted   Uterine fibroid 11/17/2020   S/P hysterectomy 11/17/2020   Vaginal leukorrhea 01/21/2008   Goiter 12/08/2007   Hypothyroidism 12/08/2007   PALPITATIONS 12/08/2007    PCP: Claudell Cruz, MD  REFERRING PROVIDER: Audelia Leaks, MD  REFERRING DIAG: N81.6 (ICD-10-CM) - Rectocele  THERAPY DIAG:  Muscle weakness (generalized)  Other lack of coordination  Rationale for Evaluation and Treatment: Rehabilitation  ONSET DATE: 02/2023  SUBJECTIVE:  SUBJECTIVE STATEMENT: Pt reports that she has had combination of digestive issues, hard time going to the bathroom and not completely emptying, Since beginning of the year She saw stage 2 rectocele, she suggested pelvic PT.  Can tell something is different, pressure, bulging, dryness.  Hysterectomy 2022, lots of fibroids, glad she did it. Still has her ovaries.  Likes to do sit ups but stopped recently because she was not sure if she is making the prolapse worse  Fluid intake: mostly water, tea and coffee  PAIN:  Are you having pain? No   PRECAUTIONS:  None  RED FLAGS: None   WEIGHT BEARING RESTRICTIONS: No  FALLS:  Has patient fallen in last 6 months? No  OCCUPATION: desk job  ACTIVITY LEVEL : walks every day  PLOF: Independent  PATIENT GOALS: get rid of the problem, leaking and "problem"- feeling like there is bulge and discomfort  PERTINENT HISTORY:  hysterectomy Sexual abuse: No  BOWEL MOVEMENT: Pain with bowel movement: No Type of bowel movement:Type (Bristol Stool Scale) 3-4- most of her time tends to be constipated, eats a lot of fiber Fully empty rectum: No- has to go several times/ day Leakage: Yes: at times- feels like she passed gas, it is not gas Pads: No Fiber supplement/laxative Yes - benefiber  URINATION: Pain with urination: No Fully empty bladder: Yes:   Stream: Strong Urgency: No Frequency: no Leakage: Coughing, Sneezing, and Exercise Pads: No  INTERCOURSE:  Ability to have vaginal penetration Yes  Pain with intercourse: none DrynessYes  Climax: no- not in a long time Marinoff Scale: 0/3 Laxative:no  PREGNANCY: Vaginal deliveries 1 Tearing Yes:   Episiotomy No C-section deliveries 1 Currently pregnant No  PROLAPSE: Pressure and Bulge   OBJECTIVE:  Note: Objective measures were completed at Evaluation unless otherwise noted.  DIAGNOSTIC FINDINGS:  None recently  PATIENT SURVEYS:    PFIQ-7: 38  COGNITION: Overall cognitive status: Within functional limits for tasks assessed     SENSATION: Light touch: Appears intact  LUMBAR SPECIAL TESTS:  Straight leg raise test: Positive for compensation of abdomen Single leg squat- bilateral knee valgus   POSTURE: rounded shoulders and forward head   LUMBARAROM/PROM:  A/PROM A/PROM % of availability  Flexion 75  Extension   Right lateral flexion 75  Left lateral flexion   Right rotation 75  Left rotation 70   (Blank rows = not tested)  LOWER EXTREMITY ROM:  full, some tightness bilateral hamstrings with trunk  flexion LOWER EXTREMITY MMT:4/5 overall  PALPATION:   General: large abdominal diastasis 1-3 fingers at and above umbilicus  Pelvic Alignment: even  Abdominal: large DRA                External Perineal Exam: mild dryness present, some decreased clitoral hood mobility                             Internal Pelvic Floor: weakness present, good tone rectally, difficulty with lengthening vaginally and rectally  Patient confirms identification and approves PT to assess internal pelvic floor and treatment Yes  PELVIC MMT:   MMT eval  Vaginal 1/5  Internal Anal Sphincter 4/5  External Anal Sphincter 4/5  Puborectalis 4/5  Diastasis Recti 1-3 finger diastasis present  (Blank rows = not tested)        TONE: average  PROLAPSE: Mild Posterior vaginal wall present in hook lying  TODAY'S TREATMENT:  DATE: 08/01/2023  EVAL see below  Neuro reed- ball press with transverse abdominis breath  PATIENT EDUCATION/ there acts:   Education details: Pt was educated on relevant anatomy, exam findings, HEP, expectations of PT, squatty potty for constipation   Person educated: Patient Education method: Explanation, Demonstration, Tactile cues, Verbal cues, and Handouts Education comprehension: verbalized understanding, returned demonstration, verbal cues required, tactile cues required, and needs further education  HOME EXERCISE PROGRAM: developing  ASSESSMENT:  CLINICAL IMPRESSION: Patient is a 53 y.o. F who was seen today for physical therapy evaluation and treatment for rectocele. She demonstrates a large diastasis, poor pressure management, weak pelvic floor contraction, posterior vaginal wall laxity and weakness throughout hips and knees. She c/o of stress urinary incontinence as well as chronic constipation. She will get a prescription of vaginal estrogen and a  squatty potty and will benefit from pelvic floor strengthening and improved pressure management to reduce bulge and vaginal pressure. She is frustrated and will benefit from PT to address deficits.   OBJECTIVE IMPAIRMENTS: decreased coordination, decreased knowledge of condition, decreased ROM, decreased strength, and improper body mechanics.   ACTIVITY LIMITATIONS: continence and toileting  PARTICIPATION LIMITATIONS: interpersonal relationship and community activity  PERSONAL FACTORS: Behavior pattern and Time since onset of injury/illness/exacerbation are also affecting patient's functional outcome.   REHAB POTENTIAL: Good  CLINICAL DECISION MAKING: Evolving/moderate complexity  EVALUATION COMPLEXITY: Moderate   GOALS: Goals reviewed with patient? Yes  SHORT TERM GOALS: Target date: 08/29/2023    Pt will be independent with HEP.   Baseline: Goal status: INITIAL  2.  Pt will be independent with use of squatty potty, relaxed toileting mechanics, and improved bowel movement techniques in order to increase ease of bowel movements and complete evacuation.   Baseline:  Goal status: INITIAL  3.  Pt will be educated on femmeze to help reduce rectocele Baseline:  Goal status: INITIAL  4.  Pt will be I with abdominal massage to help improve constipation Baseline:  Goal status: INITIAL    LONG TERM GOALS: Target date: 01/31/2024  Pt will be independent with advanced HEP.   Baseline:  Goal status: INITIAL  2.  Pt will report 7 complete BMs per week due to improved muscle tone and coordination with BMs.  Baseline:  Goal status: INITIAL  3.  Pt will have 0 fecal and urinary incontinence/ week and be able to participate in community activities without embarrassment Baseline:  Goal status: INITIAL  4.  Pt will demonstrate good pelvic floor lengthening in order to urinate and defecate with improved ease Baseline:  Goal status: INITIAL  5.  Pt will dem improved pressure  management strategies  in order not to put increased pressure on abdominal separation and her pelvic floor and worsen prolapse Baseline:  Goal status: INITIAL  PLAN:  PT FREQUENCY: 1-2x/week  PT DURATION: 6 months  PLANNED INTERVENTIONS: 97110-Therapeutic exercises, 97530- Therapeutic activity, 97112- Neuromuscular re-education, 97535- Self Care, 29562- Manual therapy, [phone]- Aquatic Therapy, [id number]- Electrical stimulation (manual), Patient/Family education, Balance training, Taping, Joint mobilization, Joint manipulation, Spinal manipulation, Spinal mobilization, Scar mobilization, Cryotherapy, Moist heat, and Biofeedback  PLAN FOR NEXT SESSION: continue strengthening for pelvic floor and focus on diastasis, abdominal massage   Maggie Senseney, PT 08/01/2023, 5:25 PM

## 2023-08-06 ENCOUNTER — Encounter: Payer: Self-pay | Admitting: Pediatrics

## 2023-08-08 ENCOUNTER — Encounter: Payer: Self-pay | Admitting: Physical Therapy

## 2023-08-08 ENCOUNTER — Ambulatory Visit: Admitting: Physical Therapy

## 2023-08-08 DIAGNOSIS — R278 Other lack of coordination: Secondary | ICD-10-CM

## 2023-08-08 DIAGNOSIS — N816 Rectocele: Secondary | ICD-10-CM | POA: Diagnosis not present

## 2023-08-08 DIAGNOSIS — M6281 Muscle weakness (generalized): Secondary | ICD-10-CM

## 2023-08-08 NOTE — Therapy (Addendum)
 OUTPATIENT PHYSICAL THERAPY FEMALE PELVIC TREATMENT   Patient Name: Dana Joseph MRN: [id number] DOB:[birth year], 53 y.o., female Today's Date: 08/08/2023  END OF SESSION:  Visit Number 2      Authorization Type [id number]  NO AUTHOR REQ     PT Start Time 1150    PT Stop Time 1230    PT Time Calculation (min) 40 min     Activity Tolerance Patient tolerated treatment well     Behavior During Therapy [facility] for tasks assessed/performed      Past Medical History:  Diagnosis Date   Allergic rhinitis    Anemia    Colon polyps    Complication of anesthesia 2016   trouble swallowing after 06-2014 total thyroidectomy for a few days, issue resolved   Dysrhythmia    heart palpitations   Headache    Hypothyroidism    PONV (postoperative nausea and vomiting)    will wear scopolamine  patch but wants to take off after a few hours   Thyroid     06-2014   Wears glasses 11/09/2020   reading only   Past Surgical History:  Procedure Laterality Date   BRAVO [facility] STUDY N/A 07/29/2013   Procedure: BRAVO PH STUDY;  Surgeon: Evangeline Hilts, MD;  Location: [facility];  Service: Endoscopy;  Laterality: N/A;   BREAST BIOPSY Right 10/04/2022   MM RT BREAST BX W LOC DEV 1ST LESION IMAGE BX SPEC STEREO GUIDE 10/04/2022 [facility]AMMOGRAP[facility]   CESAREAN SECTION  2003   CHOLECYSTECTOMY  2010   laparoscopic   COLONOSCOPY  2020   dr Garrie Kand [facility]   DEEP NECK LYMPH NODE BIOPSY / EXCISION  1998   DILATATION & CURETTAGE/HYSTEROSCOPY WITH MYOSURE N/A 03/21/2017   Procedure: DILATATION & CURETTAGE/HYSTEROSCOPY WITH MYOSURE;  Surgeon: Audelia Leaks, MD;  Location: [facility];  Service: Gynecology;  Laterality: N/A;   DILATION AND CURETTAGE OF UTERUS     ESOPHAGEAL MANOMETRY N/A 07/13/2013   Procedure: ESOPHAGEAL MANOMETRY (EM);  Surgeon: Evangeline Hilts, MD;  Location: [facility];  Service: Endoscopy;  Laterality: N/A;  Patient will arrive @ 1015 to pre op for procedure until time for mano.     ESOPHAGOGASTRODUODENOSCOPY (EGD) WITH PROPOFOL  N/A 07/29/2013   Procedure: ESOPHAGOGASTRODUODENOSCOPY (EGD) WITH PROPOFOL ;  Surgeon: Evangeline Hilts, MD;  Location: [facility];  Service: Endoscopy;  Laterality: N/A;   LYMPH NODE BIOPSY  1998   ROBOTIC ASSISTED LAPAROSCOPIC HYSTERECTOMY AND SALPINGECTOMY Bilateral 11/17/2020   Procedure: XI ROBOTIC ASSISTED LAPAROSCOPIC HYSTERECTOMY AND SALPINGECTOMY;  Surgeon: Audelia Leaks, MD;  Location: [facility];  Service: Gynecology;  Laterality: Bilateral;  Requests 3hrs. Tracie to RNFA   THYROIDECTOMY  06/2014   total   WISDOM TOOTH EXTRACTION  1990   Patient Active Problem List   Diagnosis Date Noted   Uterine fibroid 11/17/2020   S/P hysterectomy 11/17/2020   Vaginal leukorrhea 01/21/2008   Goiter 12/08/2007   Hypothyroidism 12/08/2007   PALPITATIONS 12/08/2007    PCP: Claudell Cruz, MD  REFERRING PROVIDER: Audelia Leaks, MD  REFERRING DIAG: N81.6 (ICD-10-CM) - Rectocele  THERAPY DIAG:  No diagnosis found.  Rationale for Evaluation and Treatment: Rehabilitation  ONSET DATE: 02/2023  SUBJECTIVE:  SUBJECTIVE STATEMENT: Pt reports that started estrogen feels less dryness     Pt reports that she has had combination of digestive issues, hard time going to the bathroom and not completely emptying, Since beginning of the year She saw stage 2 rectocele, she suggested pelvic PT.  Can tell something is different, pressure, bulging, dryness.  Hysterectomy 2022, lots of fibroids, glad she did it. Still has her ovaries.  Likes to do sit ups but stopped recently because she was not sure if she is making the prolapse worse  Fluid intake: mostly water, tea and coffee  PAIN:  Are you having pain? No   PRECAUTIONS: None  RED  FLAGS: None   WEIGHT BEARING RESTRICTIONS: No  FALLS:  Has patient fallen in last 6 months? No  OCCUPATION: desk job  ACTIVITY LEVEL : walks every day  PLOF: Independent  PATIENT GOALS: get rid of the problem, leaking and problem- feeling like there is bulge and discomfort  PERTINENT HISTORY:  hysterectomy Sexual abuse: No  BOWEL MOVEMENT: Pain with bowel movement: No Type of bowel movement:Type (Bristol Stool Scale) 3-4- most of her time tends to be constipated, eats a lot of fiber Fully empty rectum: No- has to go several times/ day Leakage: Yes: at times- feels like she passed gas, it is not gas Pads: No Fiber supplement/laxative Yes - benefiber  URINATION: Pain with urination: No Fully empty bladder: Yes:   Stream: Strong Urgency: No Frequency: no Leakage: Coughing, Sneezing, and Exercise Pads: No  INTERCOURSE:  Ability to have vaginal penetration Yes  Pain with intercourse: none DrynessYes  Climax: no- not in a long time Marinoff Scale: 0/3 Laxative:no  PREGNANCY: Vaginal deliveries 1 Tearing Yes:   Episiotomy No C-section deliveries 1 Currently pregnant No  PROLAPSE: Pressure and Bulge   OBJECTIVE:  Note: Objective measures were completed at Evaluation unless otherwise noted.  DIAGNOSTIC FINDINGS:  None recently  PATIENT SURVEYS:    PFIQ-7: 38  COGNITION: Overall cognitive status: Within functional limits for tasks assessed     SENSATION: Light touch: Appears intact  LUMBAR SPECIAL TESTS:  Straight leg raise test: Positive for compensation of abdomen Single leg squat- bilateral knee valgus   POSTURE: rounded shoulders and forward head   LUMBARAROM/PROM:  A/PROM A/PROM % of availability  Flexion 75  Extension   Right lateral flexion 75  Left lateral flexion   Right rotation 75  Left rotation 70   (Blank rows = not tested)  LOWER EXTREMITY ROM:  full, some tightness bilateral hamstrings with trunk flexion LOWER  EXTREMITY MMT:4/5 overall  PALPATION:   General: large abdominal diastasis 1-3 fingers at and above umbilicus  Pelvic Alignment: even  Abdominal: large DRA                External Perineal Exam: mild dryness present, some decreased clitoral hood mobility                             Internal Pelvic Floor: weakness present, good tone rectally, difficulty with lengthening vaginally and rectally  Patient confirms identification and approves PT to assess internal pelvic floor and treatment Yes  PELVIC MMT:   MMT eval  Vaginal 1/5  Internal Anal Sphincter 4/5  External Anal Sphincter 4/5  Puborectalis 4/5  Diastasis Recti 1-3 finger diastasis present  (Blank rows = not tested)        TONE: average  PROLAPSE: Mild Posterior vaginal wall present in hook  lying  TODAY'S TREATMENT:   08/08/2023 Manual- abdominal scar cupping and massage demonstrated and pt educated on- ordered her cups Neuro reed- diaphragmatic breathing hooklying                      Abdominal zipper                      PF drop and lift                      Hip adduction with ball - gentle with transverse abdominis breath  Ball press with transverse abdominis breath                                                                                                                              6/5.2025 EVAL see below  Neuro reed- ball press with transverse abdominis breath  PATIENT EDUCATION/ there acts:   Education details: Pt was educated on relevant anatomy, exam findings, HEP, expectations of PT, squatty potty for constipation   Person educated: Patient Education method: Explanation, Demonstration, Tactile cues, Verbal cues, and Handouts Education comprehension: verbalized understanding, returned demonstration, verbal cues required, tactile cues required, and needs further education  HOME EXERCISE PROGRAM: [id number]  ASSESSMENT:  CLINICAL IMPRESSION: Pt with tight and tender abdominal scar,  educated her on cupping and cupping here released the scar well. She tends to overuse her upper abdominals and was educated on zipper for her lower abdominals for improved pressure management. Needed VC's and TC's for improved and deeper diaphragmatic breathing. Pt with some weakness throughout upper and lower extremities and difficulty with diaphragmatic breathing. She will benefit from cont PT.    From Eval Patient is a 53 y.o. F who was seen today for physical therapy evaluation and treatment for rectocele. She demonstrates a large diastasis, poor pressure management, weak pelvic floor contraction, posterior vaginal wall laxity and weakness throughout hips and knees. She c/o of stress urinary incontinence as well as chronic constipation. She will get a prescription of vaginal estrogen and a squatty potty and will benefit from pelvic floor strengthening and improved pressure management to reduce bulge and vaginal pressure. She is frustrated and will benefit from PT to address deficits.   OBJECTIVE IMPAIRMENTS: decreased coordination, decreased knowledge of condition, decreased ROM, decreased strength, and improper body mechanics.   ACTIVITY LIMITATIONS: continence and toileting  PARTICIPATION LIMITATIONS: interpersonal relationship and community activity  PERSONAL FACTORS: Behavior pattern and Time since onset of injury/illness/exacerbation are also affecting patient's functional outcome.   REHAB POTENTIAL: Good  CLINICAL DECISION MAKING: Evolving/moderate complexity  EVALUATION COMPLEXITY: Moderate   GOALS: Goals reviewed with patient? Yes  SHORT TERM GOALS: Target date: 08/29/2023    Pt will be independent with HEP.   Baseline: Goal status: INITIAL  2.  Pt will be independent with use of squatty potty, relaxed toileting mechanics, and improved bowel movement techniques in order to increase  ease of bowel movements and complete evacuation.   Baseline:  Goal status: INITIAL  3.   Pt will be educated on femmeze to help reduce rectocele Baseline:  Goal status: INITIAL  4.  Pt will be I with abdominal massage to help improve constipation Baseline:  Goal status: INITIAL    LONG TERM GOALS: Target date: 01/31/2024  Pt will be independent with advanced HEP.   Baseline:  Goal status: INITIAL  2.  Pt will report 7 complete BMs per week due to improved muscle tone and coordination with BMs.  Baseline:  Goal status: INITIAL  3.  Pt will have 0 fecal and urinary incontinence/ week and be able to participate in community activities without embarrassment Baseline:  Goal status: INITIAL  4.  Pt will demonstrate good pelvic floor lengthening in order to urinate and defecate with improved ease Baseline:  Goal status: INITIAL  5.  Pt will dem improved pressure management strategies  in order not to put increased pressure on abdominal separation and her pelvic floor and worsen prolapse Baseline:  Goal status: INITIAL  PLAN:  PT FREQUENCY: 1-2x/week  PT DURATION: 6 months  PLANNED INTERVENTIONS: 97110-Therapeutic exercises, 97530- Therapeutic activity, 97112- Neuromuscular re-education, 97535- Self Care, 16109- Manual therapy, [phone]- Aquatic Therapy, [phone]- Electrical stimulation (manual), Patient/Family education, Balance training, Taping, Joint mobilization, Joint manipulation, Spinal manipulation, Spinal mobilization, Scar mobilization, Cryotherapy, Moist heat, and Biofeedback  PLAN FOR NEXT SESSION: continue strengthening for pelvic floor and focus on diastasis, abdominal massage   Loui Massenburg, PT 08/08/2023, 11:48 AM

## 2023-08-12 ENCOUNTER — Encounter: Payer: Self-pay | Admitting: Physical Therapy

## 2023-08-12 ENCOUNTER — Ambulatory Visit: Admitting: Physical Therapy

## 2023-08-12 DIAGNOSIS — R278 Other lack of coordination: Secondary | ICD-10-CM

## 2023-08-12 DIAGNOSIS — M6281 Muscle weakness (generalized): Secondary | ICD-10-CM

## 2023-08-12 DIAGNOSIS — N816 Rectocele: Secondary | ICD-10-CM | POA: Diagnosis not present

## 2023-08-12 NOTE — Therapy (Signed)
 OUTPATIENT PHYSICAL THERAPY FEMALE PELVIC TREATMENT   Patient Name: Dana Joseph MRN: [id number] DOB:Jan 11, 1971, 53 y.o., female Today's Date: 08/12/2023  END OF SESSION:  PT End of Session - 08/12/23 1251     Visit Number 2    Authorization Type [id number]  NO AUTHOR REQ    PT Start Time 1233    PT Stop Time 1315    PT Time Calculation (min) 42 min    Activity Tolerance Patient tolerated treatment well    Behavior During Therapy WFL for tasks assessed/performed         Session number 3 on 08/12/23  Past Medical History:  Diagnosis Date   Allergic rhinitis    Anemia    Colon polyps    Complication of anesthesia 2016   trouble swallowing after 06-2014 total thyroidectomy for a few days, issue resolved   Dysrhythmia    heart palpitations   Headache    Hypothyroidism    PONV (postoperative nausea and vomiting)    will wear scopolamine  patch but wants to take off after a few hours   Thyroid     06-2014   Wears glasses 11/09/2020   reading only   Past Surgical History:  Procedure Laterality Date   BRAVO [facility] STUDY N/A 07/29/2013   Procedure: BRAVO PH STUDY;  Surgeon: Evangeline Hilts, MD;  Location: [facility];  Service: Endoscopy;  Laterality: N/A;   BREAST BIOPSY Right 10/04/2022   MM RT BREAST BX W LOC DEV 1ST LESION IMAGE BX SPEC STEREO GUIDE 10/04/2022 [facility]   CESAREAN SECTION  2003   CHOLECYSTECTOMY  2010   laparoscopic   COLONOSCOPY  2020   dr Garrie Kand [facility]   DEEP NECK LYMPH NODE BIOPSY / EXCISION  1998   DILATATION & CURETTAGE/HYSTEROSCOPY WITH MYOSURE N/A 03/21/2017   Procedure: DILATATION & CURETTAGE/HYSTEROSCOPY WITH MYOSURE;  Surgeon: Audelia Leaks, MD;  Location: [facility];  Service: Gynecology;  Laterality: N/A;   DILATION AND CURETTAGE OF UTERUS     ESOPHAGEAL MANOMETRY N/A 07/13/2013   Procedure: ESOPHAGEAL MANOMETRY (EM);  Surgeon: Evangeline Hilts, MD;  Location: [facility];  Service: Endoscopy;  Laterality: N/A;  Patient will arrive @  1015 to pre op for procedure until time for mano.    ESOPHAGOGASTRODUODENOSCOPY (EGD) WITH PROPOFOL  N/A 07/29/2013   Procedure: ESOPHAGOGASTRODUODENOSCOPY (EGD) WITH PROPOFOL ;  Surgeon: Evangeline Hilts, MD;  [facility]: [facility];  Service: Endoscopy;  Laterality: N/A;   LYMPH NODE BIOPSY  1998   ROBOTIC ASSISTED LAPAROSCOPIC HYSTERECTOMY AND SALPINGECTOMY Bilateral 11/17/2020   Procedure: XI ROBOTIC ASSISTED LAPAROSCOPIC HYSTERECTOMY AND SALPINGECTOMY;  Surgeon: Audelia Leaks, MD;  Location: [facility];  Service: Gynecology;  Laterality: Bilateral;  Requests 3hrs. Tracie to RNFA   THYROIDECTOMY  06/2014   total   WISDOM TOOTH EXTRACTION  1990   Patient Active Problem List   Diagnosis Date Noted   Uterine fibroid 11/17/2020   S/P hysterectomy 11/17/2020   Vaginal leukorrhea 01/21/2008   Goiter 12/08/2007   Hypothyroidism 12/08/2007   PALPITATIONS 12/08/2007    PCP: Claudell Cruz, MD  REFERRING PROVIDER: Audelia Leaks, MD  REFERRING DIAG: N81.6 (ICD-10-CM) - Rectocele  THERAPY DIAG:  Muscle weakness (generalized)  Other lack of coordination  Rationale for Evaluation and Treatment: Rehabilitation  ONSET DATE: 02/2023  SUBJECTIVE:  SUBJECTIVE STATEMENT: Pt reports that she thinks things are improving She is doing vaginal estrogen estrogen Exercises are going well Uses a tool for splinting at home- Releve Reports incomplete emptying at times- has to go 3 times Pt reports that started estrogen feels less dryness     Pt reports that she has had combination of digestive issues, hard time going to the bathroom and not completely emptying, Since beginning of the year She saw stage 2 rectocele, she suggested pelvic PT.  Can tell something is different, pressure, bulging,  dryness.  Hysterectomy 2022, lots of fibroids, glad she did it. Still has her ovaries.  Likes to do sit ups but stopped recently because she was not sure if she is making the prolapse worse  Fluid intake: mostly water, tea and coffee  PAIN:  Are you having pain? No   PRECAUTIONS: None  RED FLAGS: None   WEIGHT BEARING RESTRICTIONS: No  FALLS:  Has patient fallen in last 6 months? No  OCCUPATION: desk job  ACTIVITY LEVEL : walks every day  PLOF: Independent  PATIENT GOALS: get rid of the problem, leaking and problem- feeling like there is bulge and discomfort  PERTINENT HISTORY:  hysterectomy Sexual abuse: No  BOWEL MOVEMENT: Pain with bowel movement: No Type of bowel movement:Type (Bristol Stool Scale) 3-4- most of her time tends to be constipated, eats a lot of fiber Fully empty rectum: No- has to go several times/ day Leakage: Yes: at times- feels like she passed gas, it is not gas Pads: No Fiber supplement/laxative Yes - benefiber  URINATION: Pain with urination: No Fully empty bladder: Yes:   Stream: Strong Urgency: No Frequency: no Leakage: Coughing, Sneezing, and Exercise Pads: No  INTERCOURSE:  Ability to have vaginal penetration Yes  Pain with intercourse: none DrynessYes  Climax: no- not in a long time Marinoff Scale: 0/3 Laxative:no  PREGNANCY: Vaginal deliveries 1 Tearing Yes:   Episiotomy No C-section deliveries 1 Currently pregnant No  PROLAPSE: Pressure and Bulge   OBJECTIVE:  Note: Objective measures were completed at Evaluation unless otherwise noted.  DIAGNOSTIC FINDINGS:  None recently  PATIENT SURVEYS:    PFIQ-7: 38  COGNITION: Overall cognitive status: Within functional limits for tasks assessed     SENSATION: Light touch: Appears intact  LUMBAR SPECIAL TESTS:  Straight leg raise test: Positive for compensation of abdomen Single leg squat- bilateral knee valgus   POSTURE: rounded shoulders and forward  head   LUMBARAROM/PROM:  A/PROM A/PROM % of availability  Flexion 75  Extension   Right lateral flexion 75  Left lateral flexion   Right rotation 75  Left rotation 70   (Blank rows = not tested)  LOWER EXTREMITY ROM:  full, some tightness bilateral hamstrings with trunk flexion LOWER EXTREMITY MMT:4/5 overall  PALPATION:   General: large abdominal diastasis 1-3 fingers at and above umbilicus  Pelvic Alignment: even  Abdominal: large DRA                External Perineal Exam: mild dryness present, some decreased clitoral hood mobility                             Internal Pelvic Floor: weakness present, good tone rectally, difficulty with lengthening vaginally and rectally  Patient confirms identification and approves PT to assess internal pelvic floor and treatment Yes  PELVIC MMT:   MMT eval  Vaginal 1/5  Internal Anal Sphincter 4/5  External Anal  Sphincter 4/5  Puborectalis 4/5  Diastasis Recti 1-3 finger diastasis present  (Blank rows = not tested)        TONE: average  PROLAPSE: Mild Posterior vaginal wall present in hook lying  TODAY'S TREATMENT:   08/12/2023 Abdominal massage Neuro reed- diaphragmatic breathing hooklying                      Abdominal zipper                      PF drop and lift                      Hip adduction with ball - gentle with transverse abdominis breath  Ball press with transverse abdominis breath  Bridging with transverse abdominis breath and red theraband  Child's pose with diaphragmatic breathing 2 mins    08/08/2023 Manual- abdominal scar cupping and massage demonstrated and pt educated on- ordered her cups Neuro reed- diaphragmatic breathing hooklying                      Abdominal zipper                      PF drop and lift                      Hip adduction with ball - gentle with transverse abdominis breath  Ball press with transverse abdominis breath                                                                                                                               6/5.2025 EVAL see below  Neuro reed- ball press with transverse abdominis breath  PATIENT EDUCATION/ there acts:   Education details: Pt was educated on relevant anatomy, exam findings, HEP, expectations of PT, squatty potty for constipation   Person educated: Patient Education method: Explanation, Demonstration, Tactile cues, Verbal cues, and Handouts Education comprehension: verbalized understanding, returned demonstration, verbal cues required, tactile cues required, and needs further education  HOME EXERCISE PROGRAM: [id number]  ASSESSMENT:  CLINICAL IMPRESSION: Pt with breath holding tendencies throughout her exercises today, req VC's and TC's for improved pressure management. Educated on abdominal massage and femmeze to reduce constipation and improve bowel emptying.She is using Releve for splinting.  Progressing well with abdominal engagement, able to engage lower abdominals and not over use upper abdominals more. Will continue to benefit from PT to address deficits.        From Eval Patient is a 53 y.o. F who was seen today for physical therapy evaluation and treatment for rectocele. She demonstrates a large diastasis, poor pressure management, weak pelvic floor contraction, posterior vaginal wall laxity and weakness throughout hips and knees. She c/o of stress urinary incontinence as well as chronic constipation. She will get a prescription of vaginal estrogen and a squatty  potty and will benefit from pelvic floor strengthening and improved pressure management to reduce bulge and vaginal pressure. She is frustrated and will benefit from PT to address deficits.   OBJECTIVE IMPAIRMENTS: decreased coordination, decreased knowledge of condition, decreased ROM, decreased strength, and improper body mechanics.   ACTIVITY LIMITATIONS: continence and toileting  PARTICIPATION LIMITATIONS: interpersonal relationship and  community activity  PERSONAL FACTORS: Behavior pattern and Time since onset of injury/illness/exacerbation are also affecting patient's functional outcome.   REHAB POTENTIAL: Good  CLINICAL DECISION MAKING: Evolving/moderate complexity  EVALUATION COMPLEXITY: Moderate   GOALS: Goals reviewed with patient? Yes  SHORT TERM GOALS: Target date: 08/29/2023    Pt will be independent with HEP.   Baseline: Goal status: progressing  2.  Pt will be independent with use of squatty potty, relaxed toileting mechanics, and improved bowel movement techniques in order to increase ease of bowel movements and complete evacuation.   Baseline:  Goal status: INITIAL  3.  Pt will be educated on femmeze to help reduce rectocele Baseline:  Goal status: met  4.  Pt will be I with abdominal massage to help improve constipation Baseline:  Goal status: met    LONG TERM GOALS: Target date: 01/31/2024  Pt will be independent with advanced HEP.   Baseline:  Goal status: INITIAL  2.  Pt will report 7 complete BMs per week due to improved muscle tone and coordination with BMs.  Baseline:  Goal status: INITIAL  3.  Pt will have 0 fecal and urinary incontinence/ week and be able to participate in community activities without embarrassment Baseline:  Goal status: INITIAL  4.  Pt will demonstrate good pelvic floor lengthening in order to urinate and defecate with improved ease Baseline:  Goal status: INITIAL  5.  Pt will dem improved pressure management strategies  in order not to put increased pressure on abdominal separation and her pelvic floor and worsen prolapse Baseline:  Goal status: INITIAL  PLAN:  PT FREQUENCY: 1-2x/week  PT DURATION: 6 months  PLANNED INTERVENTIONS: 97110-Therapeutic exercises, 97530- Therapeutic activity, 97112- Neuromuscular re-education, 97535- Self Care, 16109- Manual therapy, [phone]- Aquatic Therapy, [phone]- Electrical stimulation (manual), Patient/Family  education, Balance training, Taping, Joint mobilization, Joint manipulation, Spinal manipulation, Spinal mobilization, Scar mobilization, Cryotherapy, Moist heat, and Biofeedback  PLAN FOR NEXT SESSION: continue strengthening for pelvic floor and focus on diastasis, abdominal massage   Evren Shankland, PT 08/12/2023, 1:12 PM

## 2023-08-22 ENCOUNTER — Ambulatory Visit: Admitting: Physical Therapy

## 2023-08-22 ENCOUNTER — Encounter: Payer: Self-pay | Admitting: Physical Therapy

## 2023-08-22 DIAGNOSIS — M6281 Muscle weakness (generalized): Secondary | ICD-10-CM

## 2023-08-22 DIAGNOSIS — N816 Rectocele: Secondary | ICD-10-CM | POA: Diagnosis not present

## 2023-08-22 DIAGNOSIS — R278 Other lack of coordination: Secondary | ICD-10-CM

## 2023-08-22 NOTE — Therapy (Signed)
 OUTPATIENT PHYSICAL THERAPY FEMALE PELVIC TREATMENT   Patient Name: Endya Austin MRN: [id number] DOB:05-03-1970, 53 y.o., female Today's Date: 08/22/2023  END OF SESSION:  PT End of Session - 08/22/23 1145     Visit Number 3    Authorization Type [id number]  NO AUTHOR REQ    PT Start Time 1149    PT Stop Time 1230    PT Time Calculation (min) 41 min    Activity Tolerance Patient tolerated treatment well    Behavior During Therapy WFL for tasks assessed/performed          Session number 3 on 08/12/23  Past Medical History:  Diagnosis Date   Allergic rhinitis    Anemia    Colon polyps    Complication of anesthesia 2016   trouble swallowing after 06-2014 total thyroidectomy for a few days, issue resolved   Dysrhythmia    heart palpitations   Headache    Hypothyroidism    PONV (postoperative nausea and vomiting)    will wear scopolamine  patch but wants to take off after a few hours   Thyroid     06-2014   Wears glasses 11/09/2020   reading only   Past Surgical History:  Procedure Laterality Date   BRAVO [facility] STUDY N/A 07/29/2013   Procedure: BRAVO PH STUDY;  Surgeon: Elsie Cree, MD;  Location: [facility];  Service: Endoscopy;  Laterality: N/A;   BREAST BIOPSY Right 10/04/2022   MM RT BREAST BX W LOC DEV 1ST LESION IMAGE BX SPEC STEREO GUIDE 10/04/2022 [facility]   CESAREAN SECTION  2003   CHOLECYSTECTOMY  2010   laparoscopic   COLONOSCOPY  2020   dr daria [facility]avers labauer   DEEP NECK LYMPH NODE BIOPSY / EXCISION  1998   DILATATION & CURETTAGE/HYSTEROSCOPY WITH MYOSURE N/A 03/21/2017   Procedure: DILATATION & CURETTAGE/HYSTEROSCOPY WITH MYOSURE;  Surgeon: Kandyce Burnard, MD;  Location: [facility];  Service: Gynecology;  Laterality: N/A;   DILATION AND CURETTAGE OF UTERUS     ESOPHAGEAL MANOMETRY N/A 07/13/2013   Procedure: ESOPHAGEAL MANOMETRY (EM);  Surgeon: Elsie Cree, MD;  Location: [facility];  Service: Endoscopy;  Laterality: N/A;  Patient will arrive  @ 1015 to pre op for procedure until time for mano.    ESOPHAGOGASTRODUODENOSCOPY (EGD) WITH PROPOFOL  N/A 07/29/2013   Procedure: ESOPHAGOGASTRODUODENOSCOPY (EGD) WITH PROPOFOL ;  Surgeon: Elsie Cree, MD;  [facility]: [facility];  Service: Endoscopy;  Laterality: N/A;   LYMPH NODE BIOPSY  1998   ROBOTIC ASSISTED LAPAROSCOPIC HYSTERECTOMY AND SALPINGECTOMY Bilateral 11/17/2020   Procedure: XI ROBOTIC ASSISTED LAPAROSCOPIC HYSTERECTOMY AND SALPINGECTOMY;  Surgeon: Kandyce Burnard, MD;  Location: [facility];  Service: Gynecology;  Laterality: Bilateral;  Requests 3hrs. Tracie to RNFA   THYROIDECTOMY  06/2014   total   WISDOM TOOTH EXTRACTION  1990   Patient Active Problem List   Diagnosis Date Noted   Uterine fibroid 11/17/2020   S/P hysterectomy 11/17/2020   Vaginal leukorrhea 01/21/2008   Goiter 12/08/2007   Hypothyroidism 12/08/2007   PALPITATIONS 12/08/2007    PCP: Rena Luke POUR, MD  REFERRING PROVIDER: Kandyce Burnard, MD  REFERRING DIAG: N81.6 (ICD-10-CM) - Rectocele  THERAPY DIAG:  Muscle weakness (generalized)  Other lack of coordination  Rationale for Evaluation and Treatment: Rehabilitation  ONSET DATE: 02/2023  SUBJECTIVE:  SUBJECTIVE STATEMENT:  Pt reports that exercises are helping HEP is going well Reports that bowel emptying is slightly better Feels like bulge was dryness, felt uncomfortable, it is better now.          From eval:  Pt reports that she has had combination of digestive issues, hard time going to the bathroom and not completely emptying, Since beginning of the year She saw stage 2 rectocele, she suggested pelvic PT.  Can tell something is different, pressure, bulging, dryness.  Hysterectomy 2022, lots of fibroids, glad she did it. Still  has her ovaries.  Likes to do sit ups but stopped recently because she was not sure if she is making the prolapse worse  Fluid intake: mostly water, tea and coffee  PAIN:  Are you having pain? No   PRECAUTIONS: None  RED FLAGS: None   WEIGHT BEARING RESTRICTIONS: No  FALLS:  Has patient fallen in last 6 months? No  OCCUPATION: desk job  ACTIVITY LEVEL : walks every day  PLOF: Independent  PATIENT GOALS: get rid of the problem, leaking and problem- feeling like there is bulge and discomfort  PERTINENT HISTORY:  hysterectomy Sexual abuse: No  BOWEL MOVEMENT: Pain with bowel movement: No Type of bowel movement:Type (Bristol Stool Scale) 3-4- most of her time tends to be constipated, eats a lot of fiber Fully empty rectum: No- has to go several times/ day Leakage: Yes: at times- feels like she passed gas, it is not gas Pads: No Fiber supplement/laxative Yes - benefiber  URINATION: Pain with urination: No Fully empty bladder: Yes:   Stream: Strong Urgency: No Frequency: no Leakage: Coughing, Sneezing, and Exercise Pads: No  INTERCOURSE:  Ability to have vaginal penetration Yes  Pain with intercourse: none DrynessYes  Climax: no- not in a long time Marinoff Scale: 0/3 Laxative:no  PREGNANCY: Vaginal deliveries 1 Tearing Yes:   Episiotomy No C-section deliveries 1 Currently pregnant No  PROLAPSE: Pressure and Bulge   OBJECTIVE:  Note: Objective measures were completed at Evaluation unless otherwise noted.  DIAGNOSTIC FINDINGS:  None recently  PATIENT SURVEYS:    PFIQ-7: 38  COGNITION: Overall cognitive status: Within functional limits for tasks assessed     SENSATION: Light touch: Appears intact  LUMBAR SPECIAL TESTS:  Straight leg raise test: Positive for compensation of abdomen Single leg squat- bilateral knee valgus   POSTURE: rounded shoulders and forward head   LUMBARAROM/PROM:  A/PROM A/PROM % of availability  Flexion 75   Extension   Right lateral flexion 75  Left lateral flexion   Right rotation 75  Left rotation 70   (Blank rows = not tested)  LOWER EXTREMITY ROM:  full, some tightness bilateral hamstrings with trunk flexion LOWER EXTREMITY MMT:4/5 overall  PALPATION:   General: large abdominal diastasis 1-3 fingers at and above umbilicus  Pelvic Alignment: even  Abdominal: large DRA                External Perineal Exam: mild dryness present, some decreased clitoral hood mobility                             Internal Pelvic Floor: weakness present, good tone rectally, difficulty with lengthening vaginally and rectally  Patient confirms identification and approves PT to assess internal pelvic floor and treatment Yes  PELVIC MMT:   MMT eval  Vaginal 1/5  Internal Anal Sphincter 4/5  External Anal Sphincter 4/5  Puborectalis 4/5  Diastasis  Recti 1-3 finger diastasis present  (Blank rows = not tested)        TONE: average  PROLAPSE: Mild Posterior vaginal wall laxity present in hook lying  TODAY'S TREATMENT:  08/22/2023 Neuro reed- Neuro reed- diaphragmatic breathing hooklying                      Abdominal zipper                      PF drop and lift                      Hip adduction with ball - gentle with transverse abdominis breath  with green thera band feedback  Ball press with transverse abdominis breath  Bridging with transverse abdominis breath and  green theraband Shoulder extension with green theraband- new 20 reps STS with #15 2x10 Pulleys- rowing 2 plates 15 bilateral Pulleys palloff press 15 bilateral  Side stepping black loop 2 laps   08/12/2023 Abdominal massage Neuro reed- diaphragmatic breathing hooklying                      Abdominal zipper                      PF drop and lift                      Hip adduction with ball - gentle with transverse abdominis breath  Ball press with transverse abdominis breath  Bridging with transverse abdominis breath and  red theraband  Child's pose with diaphragmatic breathing 2 mins STS with black loop feedback with transverse abdominis breath    08/08/2023 Manual- abdominal scar cupping and massage demonstrated and pt educated on- ordered her cups Neuro reed- diaphragmatic breathing hooklying                      Abdominal zipper                      PF drop and lift                      Hip adduction with ball - gentle with transverse abdominis breath  Ball press with transverse abdominis breath                                                                                                                              6/5.2025 EVAL see below  Neuro reed- ball press with transverse abdominis breath  PATIENT EDUCATION/ there acts:   Education details: Pt was educated on relevant anatomy, exam findings, HEP, expectations of PT, squatty potty for constipation   Person educated: Patient Education method: Explanation, Demonstration, Tactile cues, Verbal cues, and Handouts Education comprehension: verbalized understanding, returned demonstration, verbal cues required, tactile cues required, and needs further  education  HOME EXERCISE PROGRAM: [id number]  ASSESSMENT:  CLINICAL IMPRESSION: Pt with improved breathing throughout her exercises today, improved pressure management. Progressing well with abdominal engagement, able to engage lower abdominals and not over use upper abdominals more. Will continue to benefit from PT to address deficits.     OBJECTIVE IMPAIRMENTS: decreased coordination, decreased knowledge of condition, decreased ROM, decreased strength, and improper body mechanics.   ACTIVITY LIMITATIONS: continence and toileting  PARTICIPATION LIMITATIONS: interpersonal relationship and community activity  PERSONAL FACTORS: Behavior pattern and Time since onset of injury/illness/exacerbation are also affecting patient's functional outcome.   REHAB POTENTIAL: Good  CLINICAL DECISION  MAKING: Evolving/moderate complexity  EVALUATION COMPLEXITY: Moderate   GOALS: Goals reviewed with patient? Yes  SHORT TERM GOALS: Target date: 08/29/2023    Pt will be independent with HEP.   Baseline: Goal status: met  2.  Pt will be independent with use of squatty potty, relaxed toileting mechanics, and improved bowel movement techniques in order to increase ease of bowel movements and complete evacuation.   Baseline:  Goal status: met  3.  Pt will be educated on femmeze to help reduce rectocele Baseline:  Goal status: met  4.  Pt will be I with abdominal massage to help improve constipation Baseline:  Goal status: met    LONG TERM GOALS: Target date: 01/31/2024  Pt will be independent with advanced HEP.   Baseline:  Goal status: INITIAL  2.  Pt will report 7 complete BMs per week due to improved muscle tone and coordination with BMs.  Baseline:  Goal status: INITIAL  3.  Pt will have 0 fecal and urinary incontinence/ week and be able to participate in community activities without embarrassment Baseline:  Goal status: INITIAL  4.  Pt will demonstrate good pelvic floor lengthening in order to urinate and defecate with improved ease Baseline:  Goal status: INITIAL  5.  Pt will dem improved pressure management strategies  in order not to put increased pressure on abdominal separation and her pelvic floor and worsen prolapse Baseline:  Goal status: INITIAL  PLAN:  PT FREQUENCY: 1-2x/week  PT DURATION: 6 months  PLANNED INTERVENTIONS: 97110-Therapeutic exercises, 97530- Therapeutic activity, 97112- Neuromuscular re-education, 97535- Self Care, [id number]- Manual therapy, [phone]- Aquatic Therapy, [phone]- Electrical stimulation (manual), Patient/Family education, Balance training, Taping, Joint mobilization, Joint manipulation, Spinal manipulation, Spinal mobilization, Scar mobilization, Cryotherapy, Moist heat, and Biofeedback  PLAN FOR NEXT SESSION: continue  strengthening for pelvic floor and focus on diastasis, abdominal massage   Jaxston Chohan, PT 08/22/2023, 11:49 AM

## 2023-08-28 ENCOUNTER — Other Ambulatory Visit: Payer: Self-pay | Admitting: Family Medicine

## 2023-08-28 DIAGNOSIS — Z1231 Encounter for screening mammogram for malignant neoplasm of breast: Secondary | ICD-10-CM

## 2023-09-12 ENCOUNTER — Encounter: Payer: Self-pay | Admitting: Physical Therapy

## 2023-09-12 ENCOUNTER — Ambulatory Visit: Attending: Obstetrics | Admitting: Physical Therapy

## 2023-09-12 DIAGNOSIS — M6281 Muscle weakness (generalized): Secondary | ICD-10-CM | POA: Insufficient documentation

## 2023-09-12 DIAGNOSIS — R278 Other lack of coordination: Secondary | ICD-10-CM | POA: Insufficient documentation

## 2023-09-12 NOTE — Therapy (Signed)
 OUTPATIENT PHYSICAL THERAPY FEMALE PELVIC TREATMENT   Patient Name: Dana Joseph MRN: [id number] DOB:1970-11-21, 53 y.o., female Today's Date: 09/12/2023  END OF SESSION:  PT End of Session - 09/12/23 1558     Visit Number 4    Authorization Type [id number]  NO AUTHOR REQ    PT Start Time 1534    PT Stop Time 1612    PT Time Calculation (min) 38 min    Activity Tolerance Patient tolerated treatment well    Behavior During Therapy WFL for tasks assessed/performed           Session number 3 on 08/12/23  Past Medical History:  Diagnosis Date   Allergic rhinitis    Anemia    Colon polyps    Complication of anesthesia 2016   trouble swallowing after 06-2014 total thyroidectomy for a few days, issue resolved   Dysrhythmia    heart palpitations   Headache    Hypothyroidism    PONV (postoperative nausea and vomiting)    will wear scopolamine  patch but wants to take off after a few hours   Thyroid     06-2014   Wears glasses 11/09/2020   reading only   Past Surgical History:  Procedure Laterality Date   BRAVO [facility] STUDY N/A 07/29/2013   Procedure: BRAVO PH STUDY;  Surgeon: Elsie Cree, MD;  [facility];  Service: Endoscopy;  Laterality: N/A;   BREAST BIOPSY Right 10/04/2022   MM RT BREAST BX W LOC DEV 1ST LESION IMAGE BX SPEC STEREO GUIDE 10/04/2022 [facility]   CESAREAN SECTION  2003   CHOLECYSTECTOMY  2010   laparoscopic   COLONOSCOPY  2020   dr daria [facility]   DEEP NECK LYMPH NODE BIOPSY / EXCISION  1998   DILATATION & CURETTAGE/HYSTEROSCOPY WITH MYOSURE N/A 03/21/2017   Procedure: DILATATION & CURETTAGE/HYSTEROSCOPY WITH MYOSURE;  Surgeon: Kandyce Burnard, MD;  Location: [facility];  Service: Gynecology;  Laterality: N/A;   DILATION AND CURETTAGE OF UTERUS     ESOPHAGEAL MANOMETRY N/A 07/13/2013   Procedure: ESOPHAGEAL MANOMETRY (EM);  Surgeon: Elsie Cree, MD;  [facility];  Service: Endoscopy;  Laterality: N/A;  Patient will  arrive @ 1015 to pre op for procedure until time for mano.    ESOPHAGOGASTRODUODENOSCOPY (EGD) WITH PROPOFOL  N/A 07/29/2013   Procedure: ESOPHAGOGASTRODUODENOSCOPY (EGD) WITH PROPOFOL ;  Surgeon: Elsie Cree, MD;  [facility];  Service: Endoscopy;  Laterality: N/A;   LYMPH NODE BIOPSY  1998   ROBOTIC ASSISTED LAPAROSCOPIC HYSTERECTOMY AND SALPINGECTOMY Bilateral 11/17/2020   Procedure: XI ROBOTIC ASSISTED LAPAROSCOPIC HYSTERECTOMY AND SALPINGECTOMY;  Surgeon: Kandyce Burnard, MD;  Location: [facility];  Service: Gynecology;  Laterality: Bilateral;  Requests 3hrs. Tracie to RNFA   THYROIDECTOMY  06/2014   total   WISDOM TOOTH EXTRACTION  1990   Patient Active Problem List   Diagnosis Date Noted   Uterine fibroid 11/17/2020   S/P hysterectomy 11/17/2020   Vaginal leukorrhea 01/21/2008   Goiter 12/08/2007   Hypothyroidism 12/08/2007   PALPITATIONS 12/08/2007    PCP: Rena Luke POUR, MD  REFERRING PROVIDER: Kandyce Burnard, MD  REFERRING DIAG: N81.6 (ICD-10-CM) - Rectocele  THERAPY DIAG:  Muscle weakness (generalized)  Other lack of coordination  Rationale for Evaluation and Treatment: Rehabilitation  ONSET DATE: 02/2023  SUBJECTIVE:  SUBJECTIVE STATEMENT: Patient reports that she feels like she little bit better bowel emptying.  Bristol stool scale is anywhere 1-3 She got new thyroid  medicine Has a new appt in GI  Urine leaking has been better She splints sometimes Has been trying all sorts of fiber      From eval:  Pt reports that she has had combination of digestive issues, hard time going to the bathroom and not completely emptying, Since beginning of the year She saw stage 2 rectocele, she suggested pelvic PT.  Can tell something is different, pressure,  bulging, dryness.  Hysterectomy 2022, lots of fibroids, glad she did it. Still has her ovaries.  Likes to do sit ups but stopped recently because she was not sure if she is making the prolapse worse  Fluid intake: mostly water, tea and coffee  PAIN:  Are you having pain? No   PRECAUTIONS: None  RED FLAGS: None   WEIGHT BEARING RESTRICTIONS: No  FALLS:  Has patient fallen in last 6 months? No  OCCUPATION: desk job  ACTIVITY LEVEL : walks every day  PLOF: Independent  PATIENT GOALS: get rid of the problem, leaking and problem- feeling like there is bulge and discomfort  PERTINENT HISTORY:  hysterectomy Sexual abuse: No  BOWEL MOVEMENT: Pain with bowel movement: No Type of bowel movement:Type (Bristol Stool Scale) 3-4- most of her time tends to be constipated, eats a lot of fiber Fully empty rectum: No- has to go several times/ day Leakage: Yes: at times- feels like she passed gas, it is not gas Pads: No Fiber supplement/laxative Yes - benefiber  URINATION: Pain with urination: No Fully empty bladder: Yes:   Stream: Strong Urgency: No Frequency: no Leakage: Coughing, Sneezing, and Exercise Pads: No  INTERCOURSE:  Ability to have vaginal penetration Yes  Pain with intercourse: none DrynessYes  Climax: no- not in a long time Marinoff Scale: 0/3 Laxative:no  PREGNANCY: Vaginal deliveries 1 Tearing Yes:   Episiotomy No C-section deliveries 1 Currently pregnant No  PROLAPSE: Pressure and Bulge   OBJECTIVE:  Note: Objective measures were completed at Evaluation unless otherwise noted.  DIAGNOSTIC FINDINGS:  None recently  PATIENT SURVEYS:    PFIQ-7: 38  COGNITION: Overall cognitive status: Within functional limits for tasks assessed     SENSATION: Light touch: Appears intact  LUMBAR SPECIAL TESTS:  Straight leg raise test: Positive for compensation of abdomen Single leg squat- bilateral knee valgus   POSTURE: rounded shoulders and  forward head   LUMBARAROM/PROM:  A/PROM A/PROM % of availability  Flexion 75  Extension   Right lateral flexion 75  Left lateral flexion   Right rotation 75  Left rotation 70   (Blank rows = not tested)  LOWER EXTREMITY ROM:  full, some tightness bilateral hamstrings with trunk flexion LOWER EXTREMITY MMT:4/5 overall  PALPATION:   General: large abdominal diastasis 1-3 fingers at and above umbilicus  Pelvic Alignment: even  Abdominal: large DRA                External Perineal Exam: mild dryness present, some decreased clitoral hood mobility                             Internal Pelvic Floor: weakness present, good tone rectally, difficulty with lengthening vaginally and rectally  Patient confirms identification and approves PT to assess internal pelvic floor and treatment Yes  PELVIC MMT:   MMT eval  Vaginal 1/5  Internal  Anal Sphincter 4/5  External Anal Sphincter 4/5  Puborectalis 4/5  Diastasis Recti 1-3 finger diastasis present  (Blank rows = not tested)        TONE: average  PROLAPSE: Mild Posterior vaginal wall laxity present in hook lying  TODAY'S TREATMENT:  09/12/2023 Manual therapy- abdominal massage for constipation  Neuro reed-open books with diaphragmatic breathing 15 reps bilat Bridging with transverse abdominis breath 20 reps Side stepping with black loop 3 laps There acts- demonstration on abdominal splinting with sheet ( scarf) for improved defecation and emptying  08/22/2023  Neuro reed- diaphragmatic breathing hooklying                      Abdominal zipper                      PF drop and lift                      Hip adduction with ball - gentle with transverse abdominis breath  with green thera band feedback  Ball press with transverse abdominis breath  Bridging with transverse abdominis breath and  green theraband Shoulder extension with green theraband- new 20 reps STS with #15 2x10 Pulleys- rowing 2 plates 15 bilateral Pulleys  palloff press 15 bilateral  Side stepping black loop 2 laps   08/12/2023 Abdominal massage Neuro reed- diaphragmatic breathing hooklying                      Abdominal zipper                      PF drop and lift                      Hip adduction with ball - gentle with transverse abdominis breath  Ball press with transverse abdominis breath  Bridging with transverse abdominis breath and red theraband  Child's pose with diaphragmatic breathing 2 mins STS with black loop feedback with transverse abdominis breath    08/08/2023 Manual- abdominal scar cupping and massage demonstrated and pt educated on- ordered her cups Neuro reed- diaphragmatic breathing hooklying                      Abdominal zipper                      PF drop and lift                      Hip adduction with ball - gentle with transverse abdominis breath  Ball press with transverse abdominis breath                                                                                                                              6/5.2025 EVAL see below  Neuro reed- ball press  with transverse abdominis breath  PATIENT EDUCATION/ there acts:   Education details: Pt was educated on relevant anatomy, exam findings, HEP, expectations of PT, squatty potty for constipation   Person educated: Patient Education method: Explanation, Demonstration, Tactile cues, Verbal cues, and Handouts Education comprehension: verbalized understanding, returned demonstration, verbal cues required, tactile cues required, and needs further education  HOME EXERCISE PROGRAM: [id number]  ASSESSMENT:  CLINICAL IMPRESSION: Patient still with difficulty emptying and constipation. Urinary leakage is better, reports consistency with exercises.  Patient not on estrogen patch, will look into it after her mammogram. Will try kiwi and gum for improved digestion stimulation and fiber. Will try abdominal support with sheet or scarf- wrapped around her  body, crossed in the front for improved defecation Will continue to benefit from PT to address deficits.     OBJECTIVE IMPAIRMENTS: decreased coordination, decreased knowledge of condition, decreased ROM, decreased strength, and improper body mechanics.   ACTIVITY LIMITATIONS: continence and toileting  PARTICIPATION LIMITATIONS: interpersonal relationship and community activity  PERSONAL FACTORS: Behavior pattern and Time since onset of injury/illness/exacerbation are also affecting patient's functional outcome.   REHAB POTENTIAL: Good  CLINICAL DECISION MAKING: Evolving/moderate complexity  EVALUATION COMPLEXITY: Moderate   GOALS: Goals reviewed with patient? Yes  SHORT TERM GOALS: Target date: 08/29/2023    Pt will be independent with HEP.   Baseline: Goal status: met  2.  Pt will be independent with use of squatty potty, relaxed toileting mechanics, and improved bowel movement techniques in order to increase ease of bowel movements and complete evacuation.   Baseline:  Goal status: met  3.  Pt will be educated on femmeze to help reduce rectocele Baseline:  Goal status: met  4.  Pt will be I with abdominal massage to help improve constipation Baseline:  Goal status: met    LONG TERM GOALS: Target date: 01/31/2024  Pt will be independent with advanced HEP.   Baseline:  Goal status: INITIAL  2.  Pt will report 7 complete BMs per week due to improved muscle tone and coordination with BMs.  Baseline:  Goal status: INITIAL  3.  Pt will have 0 fecal and urinary incontinence/ week and be able to participate in community activities without embarrassment Baseline:  Goal status: INITIAL  4.  Pt will demonstrate good pelvic floor lengthening in order to urinate and defecate with improved ease Baseline:  Goal status: INITIAL  5.  Pt will dem improved pressure management strategies  in order not to put increased pressure on abdominal separation and her pelvic floor and  worsen prolapse Baseline:  Goal status: INITIAL  PLAN:  PT FREQUENCY: 1-2x/week  PT DURATION: 6 months  PLANNED INTERVENTIONS: 97110-Therapeutic exercises, 97530- Therapeutic activity, 97112- Neuromuscular re-education, 97535- Self Care, 02859- Manual therapy, [phone]- Aquatic Therapy, [phone]- Electrical stimulation (manual), Patient/Family education, Balance training, Taping, Joint mobilization, Joint manipulation, Spinal manipulation, Spinal mobilization, Scar mobilization, Cryotherapy, Moist heat, and Biofeedback  PLAN FOR NEXT SESSION: continue strengthening for pelvic floor and focus on diastasis, abdominal massage   Wilhemenia Camba, PT 09/12/2023, 3:58 PM

## 2023-09-27 ENCOUNTER — Ambulatory Visit
Admission: RE | Admit: 2023-09-27 | Discharge: 2023-09-27 | Disposition: A | Discharge status: Home / Self Care | Source: Ambulatory Visit | Attending: Family Medicine | Admitting: Family Medicine

## 2023-09-27 DIAGNOSIS — Z1231 Encounter for screening mammogram for malignant neoplasm of breast: Secondary | ICD-10-CM

## 2023-10-01 ENCOUNTER — Ambulatory Visit: Attending: Obstetrics | Admitting: Physical Therapy

## 2023-10-01 ENCOUNTER — Encounter: Payer: Self-pay | Admitting: Physical Therapy

## 2023-10-01 DIAGNOSIS — R278 Other lack of coordination: Secondary | ICD-10-CM | POA: Insufficient documentation

## 2023-10-01 DIAGNOSIS — M6281 Muscle weakness (generalized): Secondary | ICD-10-CM | POA: Diagnosis present

## 2023-10-01 NOTE — Therapy (Signed)
 OUTPATIENT PHYSICAL THERAPY FEMALE PELVIC TREATMENT   Patient Name: Dana Joseph MRN: [id number] DOB:Jan 05, 1971, 53 y.o., female Today's Date: 10/01/2023  END OF SESSION:  PT End of Session - 10/01/23 1652     Visit Number 5    Date for PT Re-Evaluation 01/31/24    Authorization Type [id number]  NO AUTHOR REQ    PT Start Time 1623    PT Stop Time 1705    PT Time Calculation (min) 42 min    Activity Tolerance Patient tolerated treatment well    Behavior During Therapy WFL for tasks assessed/performed            Session number 3 on 08/12/23  Past Medical History:  Diagnosis Date   Allergic rhinitis    Anemia    Colon polyps    Complication of anesthesia 2016   trouble swallowing after 06-2014 total thyroidectomy for a few days, issue resolved   Dysrhythmia    heart palpitations   Headache    Hypothyroidism    PONV (postoperative nausea and vomiting)    will wear scopolamine  patch but wants to take off after a few hours   Thyroid     06-2014   Wears glasses 11/09/2020   reading only   Past Surgical History:  Procedure Laterality Date   BRAVO [facility] STUDY N/A 07/29/2013   Procedure: BRAVO PH STUDY;  Surgeon: Elsie Cree, MD;  Location: [facility];  Service: Endoscopy;  Laterality: N/A;   BREAST BIOPSY Right 10/04/2022   MM RT BREAST BX W LOC DEV 1ST LESION IMAGE BX SPEC STEREO GUIDE 10/04/2022 [facility]   CESAREAN SECTION  2003   CHOLECYSTECTOMY  2010   laparoscopic   COLONOSCOPY  2020   dr daria [facility]avers lab[facility]   DEEP NECK LYMPH NODE BIOPSY / EXCISION  1998   DILATATION & CURETTAGE/HYSTEROSCOPY WITH MYOSURE N/A 03/21/2017   Procedure: DILATATION & CURETTAGE/HYSTEROSCOPY WITH MYOSURE;  Surgeon: Kandyce Burnard, MD;  Location: [facility];  Service: Gynecology;  Laterality: N/A;   DILATION AND CURETTAGE OF UTERUS     ESOPHAGEAL MANOMETRY N/A 07/13/2013   Procedure: ESOPHAGEAL MANOMETRY (EM);  Surgeon: Elsie Cree, MD;  [facility];  Service:  Endoscopy;  Laterality: N/A;  Patient will arrive @ 1015 to pre op for procedure until time for mano.    ESOPHAGOGASTRODUODENOSCOPY (EGD) WITH PROPOFOL  N/A 07/29/2013   Procedure: ESOPHAGOGASTRODUODENOSCOPY (EGD) WITH PROPOFOL ;  Surgeon: Elsie Cree, MD;  [facility];  Service: Endoscopy;  Laterality: N/A;   LYMPH NODE BIOPSY  1998   ROBOTIC ASSISTED LAPAROSCOPIC HYSTERECTOMY AND SALPINGECTOMY Bilateral 11/17/2020   Procedure: XI ROBOTIC ASSISTED LAPAROSCOPIC HYSTERECTOMY AND SALPINGECTOMY;  Surgeon: Kandyce Burnard, MD;  Location: [facility];  Service: Gynecology;  Laterality: Bilateral;  Requests 3hrs. Tracie to RNFA   THYROIDECTOMY  06/2014   total   WISDOM TOOTH EXTRACTION  1990   Patient Active Problem List   Diagnosis Date Noted   Uterine fibroid 11/17/2020   S/P hysterectomy 11/17/2020   Vaginal leukorrhea 01/21/2008   Goiter 12/08/2007   Hypothyroidism 12/08/2007   PALPITATIONS 12/08/2007    PCP: Rena Luke POUR, MD  REFERRING PROVIDER: Kandyce Burnard, MD  REFERRING DIAG: N81.6 (ICD-10-CM) - Rectocele  THERAPY DIAG:  Muscle weakness (generalized)  Other lack of coordination  Rationale for Evaluation and Treatment: Rehabilitation  ONSET DATE: 02/2023  SUBJECTIVE:  SUBJECTIVE STATEMENT: Patient reports that she is the same, doing exercises every day, urine leakage is definitely better. Bristol stool scale 1-3, goes 2-3 times/ day Feels like she was almost incontinent with bowel 80-90%, urine leaking 80-90%. Overall intestinal bloating is still there, first thing in the morning     Patient reports that she feels like she little bit better bowel emptying.  Bristol stool scale is anywhere 1-3 She got new thyroid  medicine Has a new appt in GI  Urine  leaking has been better She splints sometimes Has been trying all sorts of fiber      From eval:  Pt reports that she has had combination of digestive issues, hard time going to the bathroom and not completely emptying, Since beginning of the year She saw stage 2 rectocele, she suggested pelvic PT.  Can tell something is different, pressure, bulging, dryness.  Hysterectomy 2022, lots of fibroids, glad she did it. Still has her ovaries.  Likes to do sit ups but stopped recently because she was not sure if she is making the prolapse worse  Fluid intake: mostly water, tea and coffee  PAIN:  Are you having pain? No   PRECAUTIONS: None  RED FLAGS: None   WEIGHT BEARING RESTRICTIONS: No  FALLS:  Has patient fallen in last 6 months? No  OCCUPATION: desk job  ACTIVITY LEVEL : walks every day  PLOF: Independent  PATIENT GOALS: get rid of the problem, leaking and problem- feeling like there is bulge and discomfort  PERTINENT HISTORY:  hysterectomy Sexual abuse: No  BOWEL MOVEMENT: Pain with bowel movement: No Type of bowel movement:Type (Bristol Stool Scale) 3-4- most of her time tends to be constipated, eats a lot of fiber Fully empty rectum: No- has to go several times/ day Leakage: Yes: at times- feels like she passed gas, it is not gas Pads: No Fiber supplement/laxative Yes - benefiber  URINATION: Pain with urination: No Fully empty bladder: Yes:   Stream: Strong Urgency: No Frequency: no Leakage: Coughing, Sneezing, and Exercise Pads: No  INTERCOURSE:  Ability to have vaginal penetration Yes  Pain with intercourse: none DrynessYes  Climax: no- not in a long time Marinoff Scale: 0/3 Laxative:no  PREGNANCY: Vaginal deliveries 1 Tearing Yes:   Episiotomy No C-section deliveries 1 Currently pregnant No  PROLAPSE: Pressure and Bulge   OBJECTIVE:  Note: Objective measures were completed at Evaluation unless otherwise noted.  DIAGNOSTIC FINDINGS:   None recently  PATIENT SURVEYS:    PFIQ-7: 38  COGNITION: Overall cognitive status: Within functional limits for tasks assessed     SENSATION: Light touch: Appears intact  LUMBAR SPECIAL TESTS:  Straight leg raise test: Positive for compensation of abdomen Single leg squat- bilateral knee valgus   POSTURE: rounded shoulders and forward head   LUMBARAROM/PROM:  A/PROM A/PROM % of availability  Flexion 75  Extension   Right lateral flexion 75  Left lateral flexion   Right rotation 75  Left rotation 70   (Blank rows = not tested)  LOWER EXTREMITY ROM:  full, some tightness bilateral hamstrings with trunk flexion LOWER EXTREMITY MMT:4/5 overall  PALPATION:   General: large abdominal diastasis 1-3 fingers at and above umbilicus  Pelvic Alignment: even  Abdominal: large DRA                External Perineal Exam: mild dryness present, some decreased clitoral hood mobility  Internal Pelvic Floor: weakness present, good tone rectally, difficulty with lengthening vaginally and rectally  Patient confirms identification and approves PT to assess internal pelvic floor and treatment Yes  PELVIC MMT:   MMT eval  Vaginal 1/5  Internal Anal Sphincter 4/5  External Anal Sphincter 4/5  Puborectalis 4/5  Diastasis Recti 1-3 finger diastasis present  (Blank rows = not tested)        TONE: average  PROLAPSE: Mild Posterior vaginal wall laxity present in hook lying  TODAY'S TREATMENT:  10/01/2023 Manual therapy- abdominal massage for constipation and DRA, scar massage abdomen Neuro reed- chest press 20 reps Palloff press pulleys 1 plate 20 reps bilat Shoulder extensions 20 reps 1 plate Shoulder adduction 20 reps 1 plate  Shrink wrap lower abdomen 10 reps  09/12/2023 Manual therapy- abdominal massage for constipation  Neuro reed-open books with diaphragmatic breathing 15 reps bilat Bridging with transverse abdominis breath 20  reps Side stepping with black loop 3 laps There acts- demonstration on abdominal splinting with sheet ( scarf) for improved defecation and emptying  08/22/2023  Neuro reed- diaphragmatic breathing hooklying                      Abdominal zipper                      PF drop and lift                      Hip adduction with ball - gentle with transverse abdominis breath  with green thera band feedback  Ball press with transverse abdominis breath  Bridging with transverse abdominis breath and  green theraband Shoulder extension with green theraband- new 20 reps STS with #15 2x10 Pulleys- rowing 2 plates 15 bilateral Pulleys palloff press 15 bilateral  Side stepping black loop 2 laps   08/12/2023 Abdominal massage Neuro reed- diaphragmatic breathing hooklying                      Abdominal zipper                      PF drop and lift                      Hip adduction with ball - gentle with transverse abdominis breath  Ball press with transverse abdominis breath  Bridging with transverse abdominis breath and red theraband  Child's pose with diaphragmatic breathing 2 mins STS with black loop feedback with transverse abdominis breath    08/08/2023 Manual- abdominal scar cupping and massage demonstrated and pt educated on- ordered her cups Neuro reed- diaphragmatic breathing hooklying                      Abdominal zipper                      PF drop and lift                      Hip adduction with ball - gentle with transverse abdominis breath  Ball press with transverse abdominis breath  6/5.2025 EVAL see below  Neuro reed- ball press with transverse abdominis breath  PATIENT EDUCATION/ there acts:   Education details: Pt was educated on relevant anatomy, exam findings, HEP, expectations of PT, squatty potty for constipation   Person educated:  Patient Education method: Explanation, Demonstration, Tactile cues, Verbal cues, and Handouts Education comprehension: verbalized understanding, returned demonstration, verbal cues required, tactile cues required, and needs further education  HOME EXERCISE PROGRAM: [id number]  ASSESSMENT:  CLINICAL IMPRESSION: Patient still with bloating, Urinary leakage and constipation is better, reports consistency with exercises.  Patient with large diastasis rectus, tx focused on addressing myofascial restrictions and exercises for improved pressure management.  Will continue to benefit from PT to address deficits.     OBJECTIVE IMPAIRMENTS: decreased coordination, decreased knowledge of condition, decreased ROM, decreased strength, and improper body mechanics.   ACTIVITY LIMITATIONS: continence and toileting  PARTICIPATION LIMITATIONS: interpersonal relationship and community activity  PERSONAL FACTORS: Behavior pattern and Time since onset of injury/illness/exacerbation are also affecting patient's functional outcome.   REHAB POTENTIAL: Good  CLINICAL DECISION MAKING: Evolving/moderate complexity  EVALUATION COMPLEXITY: Moderate   GOALS: Goals reviewed with patient? Yes  SHORT TERM GOALS: Target date: 08/29/2023    Pt will be independent with HEP.   Baseline: Goal status: met  2.  Pt will be independent with use of squatty potty, relaxed toileting mechanics, and improved bowel movement techniques in order to increase ease of bowel movements and complete evacuation.   Baseline:  Goal status: met  3.  Pt will be educated on femmeze to help reduce rectocele Baseline:  Goal status: met  4.  Pt will be I with abdominal massage to help improve constipation Baseline:  Goal status: met    LONG TERM GOALS: Target date: 01/31/2024  Pt will be independent with advanced HEP.   Baseline:  Goal status: INITIAL  2.  Pt will report 7 complete BMs per week due to improved muscle tone and  coordination with BMs.  Baseline:  Goal status: INITIAL  3.  Pt will have 0 fecal and urinary incontinence/ week and be able to participate in community activities without embarrassment Baseline:  Goal status: INITIAL  4.  Pt will demonstrate good pelvic floor lengthening in order to urinate and defecate with improved ease Baseline:  Goal status: INITIAL  5.  Pt will dem improved pressure management strategies  in order not to put increased pressure on abdominal separation and her pelvic floor and worsen prolapse Baseline:  Goal status: INITIAL  PLAN:  PT FREQUENCY: 1-2x/week  PT DURATION: 6 months  PLANNED INTERVENTIONS: 97110-Therapeutic exercises, 97530- Therapeutic activity, 97112- Neuromuscular re-education, 97535- Self Care, [id number]- Manual therapy, [phone]- Aquatic Therapy, [phone]- Electrical stimulation (manual), Patient/Family education, Balance training, Taping, Joint mobilization, Joint manipulation, Spinal manipulation, Spinal mobilization, Scar mobilization, Cryotherapy, Moist heat, and Biofeedback  PLAN FOR NEXT SESSION: continue strengthening for pelvic floor and focus on diastasis, abdominal massage   Tabia Landowski, PT 10/01/2023, 5:00 PM

## 2023-10-03 NOTE — Progress Notes (Unsigned)
 [facility] Initial Consultation   Referring Provider Rena Luke POUR, MD [address] [place],  [place] [address]  Primary Care Provider Briscoe, Luke POUR, MD  Patient Profile: Dana Joseph is a 53 y.o. female who is seen in consultation in the [facility] at the request of Dr. Rena for evaluation and management of the problem(s) noted below.  Problem List: Chronic constipation Bloating Reported history of adenomatous colon polyps Family history of colon cancer-maternal grandmother History of pill dysphagia History of esophageal spasms Status post cholecystectomy for biliary dyskinesia   History of Present Illness   Ms. Dana Joseph is a 53 y.o. female with a history of hypothyroidism, goiter.  Discussed the use of AI scribe software for clinical note transcription with the patient, who gave verbal consent to proceed.  History of Present Illness     Last colonoscopy: 04/2018 - normal, redundant and tortuous colon Last endoscopy: 07/2013 -normal EGD, Bravo capsule placed  Last Abd CT/CTE/MRE: ***  GI Review of Symptoms Significant for {GIROS:[id number]}. Otherwise negative.  General Review of Systems  Review of systems is significant for the pertinent positives and negatives as listed per the HPI.  Full ROS is otherwise negative.  Past Medical History   Past Medical History:  Diagnosis Date   Allergic rhinitis    Anemia    Colon polyps    Complication of anesthesia 2016   trouble swallowing after 06-2014 total thyroidectomy for a few days, issue resolved   Dysrhythmia    heart palpitations   Headache    Hypothyroidism    PONV (postoperative nausea and vomiting)    will wear scopolamine  patch but wants to take off after a few hours   Thyroid     06-2014   Wears glasses 11/09/2020   reading only     Past Surgical History   Past Surgical History:  Procedure Laterality Date   BRAVO PH STUDY N/A 07/29/2013   Procedure: BRAVO  PH STUDY;  Surgeon: Elsie Cree, MD;  [facility];  Service: Endoscopy;  Laterality: N/A;   BREAST BIOPSY Right 10/04/2022   MM RT BREAST BX W LOC DEV 1ST LESION IMAGE BX SPEC STEREO GUIDE 10/04/2022 [facility]   CESAREAN SECTION  2003   CHOLECYSTECTOMY  2010   laparoscopic   COLONOSCOPY  2020   dr daria [facility]vers labauer   DEEP NECK LYMPH NODE BIOPSY / EXCISION  1998   DILATATION & CURETTAGE/HYSTEROSCOPY WITH MYOSURE N/A 03/21/2017   Procedure: DILATATION & CURETTAGE/HYSTEROSCOPY WITH MYOSURE;  Surgeon: Kandyce Burnard, MD;  Location: [facility];  Service: Gynecology;  Laterality: N/A;   DILATION AND CURETTAGE OF UTERUS     ESOPHAGEAL MANOMETRY N/A 07/13/2013   Procedure: ESOPHAGEAL MANOMETRY (EM);  Surgeon: Elsie Cree, MD;  [facility];  Service: Endoscopy;  Laterality: N/A;  Patient will arrive @ 1015 to pre op for procedure until time for mano.    ESOPHAGOGASTRODUODENOSCOPY (EGD) WITH PROPOFOL  N/A 07/29/2013   Procedure: ESOPHAGOGASTRODUODENOSCOPY (EGD) WITH PROPOFOL ;  Surgeon: Elsie Cree, MD;  [facility];  Service: Endoscopy;  Laterality: N/A;   LYMPH NODE BIOPSY  1998   ROBOTIC ASSISTED LAPAROSCOPIC HYSTERECTOMY AND SALPINGECTOMY Bilateral 11/17/2020   Procedure: XI ROBOTIC ASSISTED LAPAROSCOPIC HYSTERECTOMY AND SALPINGECTOMY;  Surgeon: Kandyce Burnard, MD;  Location: [facility];  Service: Gynecology;  Laterality: Bilateral;  Requests 3hrs. Tracie to RNFA   THYROIDECTOMY  06/2014   total   WISDOM TOOTH EXTRACTION  1990     Allergies and Medications  Allergies  Allergen Reactions   Aspirin Itching    mouth was itching    @MEDSTODAY @  Family History   Family History  Problem Relation Age of Onset   Breast cancer Maternal Grandmother    Colon cancer Maternal Grandmother    Hypertension Mother    Heart disease Father    Esophageal cancer Neg Hx      Social History   Social History   Tobacco Use    Smoking status: Never   Smokeless tobacco: Never  Vaping Use   Vaping status: Never Used  Substance Use Topics   Alcohol use: Yes    Comment: rare   Drug use: Never   [name] reports that she has never smoked. She has never used smokeless tobacco. She reports current alcohol use. She reports that she does not use drugs.  Vital Signs and Physical Examination   There were no vitals filed for this visit. There is no height or weight on file to calculate BMI.    General: Well developed, well nourished, no acute distress Head: Normocephalic and atraumatic Eyes: Sclerae anicteric, EOMI Ears: Normal auditory acuity Mouth: No deformities or lesions noted Lungs: Clear throughout to auscultation Heart: Regular rate and rhythm; No murmurs, rubs or bruits Abdomen: Soft, non tender and non distended. No masses, hepatosplenomegaly or hernias noted. Normal Bowel sounds Rectal: Musculoskeletal: Symmetrical with no gross deformities  Pulses:  Normal pulses noted Extremities: No edema or deformities noted Neurological: Alert oriented x 4, grossly nonfocal Psychological:  Alert and cooperative. Normal mood and affect  Review of Data  The following data was reviewed at the time of this encounter:  Laboratory Studies      Latest Ref Rng & Units 11/18/2020    2:15 AM 11/15/2020   12:01 PM 03/21/2017   11:30 AM  CBC  WBC 4.0 - 10.5 K/uL 11.6  6.5  5.7   Hemoglobin 12.0 - 15.0 g/dL 86.8  85.8  87.3   Hematocrit 36.0 - 46.0 % 38.3  42.7  37.7   Platelets 150 - 400 K/uL 181  213  227     No results found for: LIPASE    Latest Ref Rng & Units 11/15/2020   12:01 PM 12/16/2007    8:22 AM 11/29/2007   10:10 PM  CMP  Glucose 70 - 99 mg/dL 95  82  98   BUN 6 - 20 mg/dL 27  12  12    Creatinine 0.44 - 1.00 mg/dL 9.30  0.6  0.7   Sodium 135 - 145 mmol/L 140  138  140   Potassium 3.5 - 5.1 mmol/L 4.7  4.1  3.6   Chloride 98 - 111 mmol/L 107  104  103   CO2 22 - 32 mmol/L 27  30  29    Calcium  8.9 - 10.3 mg/dL 9.4  9.0  9.3   Total Protein 6.0 - 8.3 g/dL  7.2    Total Bilirubin 0.3 - 1.2 mg/dL  1.0    Alkaline Phos 39 - 117 units/L  32    AST 0 - 37 units/L  16    ALT 0 - 35 units/L  17       Imaging Studies  Barium esophagram 06/2013 1. Moderate gastroesophageal reflux.  2. Slight delay in passage of the barium pill into the stomach, but  the pill did pass intact.   GI Procedures and Studies  Colonoscopy 04/2018 Normal, redundant and tortuous colon  EGD 07/2013 Normal, Bravo capsule placed  Clinical Impression  It is my clinical impression that Ms. Gillum is a 53 y.o. female with;  ***  Plan  *** *** *** *** ***  Planned Follow Up No follow-ups on file.  The patient or caregiver verbalized understanding of the material covered, with no barriers to understanding. All questions were answered. Patient or caregiver is agreeable with the plan outlined above.    It was a pleasure to see Burnard.  If you have any questions or concerns regarding this evaluation, do not hesitate to contact me.  Inocente Hausen, MD [facility]

## 2023-10-04 ENCOUNTER — Encounter: Payer: Self-pay | Admitting: Pediatrics

## 2023-10-04 ENCOUNTER — Ambulatory Visit: Admitting: Pediatrics

## 2023-10-04 ENCOUNTER — Other Ambulatory Visit (INDEPENDENT_AMBULATORY_CARE_PROVIDER_SITE_OTHER)

## 2023-10-04 VITALS — BP 110/64 | HR 68 | Ht 66.5 in | Wt 115.0 lb

## 2023-10-04 DIAGNOSIS — Z800 Family history of malignant neoplasm of digestive organs: Secondary | ICD-10-CM

## 2023-10-04 DIAGNOSIS — K5900 Constipation, unspecified: Secondary | ICD-10-CM | POA: Diagnosis not present

## 2023-10-04 DIAGNOSIS — R140 Abdominal distension (gaseous): Secondary | ICD-10-CM

## 2023-10-04 DIAGNOSIS — Z8719 Personal history of other diseases of the digestive system: Secondary | ICD-10-CM

## 2023-10-04 DIAGNOSIS — K5909 Other constipation: Secondary | ICD-10-CM

## 2023-10-04 DIAGNOSIS — Z1211 Encounter for screening for malignant neoplasm of colon: Secondary | ICD-10-CM

## 2023-10-04 DIAGNOSIS — Z860101 Personal history of adenomatous and serrated colon polyps: Secondary | ICD-10-CM

## 2023-10-04 DIAGNOSIS — N816 Rectocele: Secondary | ICD-10-CM | POA: Diagnosis not present

## 2023-10-04 NOTE — Patient Instructions (Signed)
 Your provider has requested that you go to the basement level for lab work before leaving today. Press B on the elevator. The lab is located at the first door on the left as you exit the elevator.  Due to recent changes in healthcare laws, you may see the results of your imaging and laboratory studies on MyChart before your provider has had a chance to review them.  We understand that in some cases there may be results that are confusing or concerning to you. Not all laboratory results come back in the same time frame and the provider may be waiting for multiple results in order to interpret others.  Please give us  48 hours in order for your provider to thoroughly review all the results before contacting the office for clarification of your results.    You have been given a testing kit to check for small intestine bacterial overgrowth (SIBO) which is completed by a company named [employer]. Make sure to return your test in the mail using the return mailing label given to you along with the kit. The test order, your demographic and insurance information have all already been sent to the company. [facility] will collect an upfront charge of $109.00 for commercial insurance plans and $229.00 if you are paying cash. The potential remaining total after claim submission and review is $120.00. Make sure to discuss with [facility] PRIOR to having the test to see if they have gotten information from your insurance company as to how much your testing will cost out of pocket, if any. Please contact [facility] at phone number [phone] to get instructions regarding how to perform the test as our office is unable to give specific testing instructions.   We are giving you a Low-FODMAP diet handout today. FODMAPs are short-chain carbohydrates (sugars) that are highly fermentable, which means that they go through chemical changes in the GI system, and are poorly absorbed during digestion. When  FODMAPs reach the colon (large intestine), bacteria ferment these sugars, turning them into gas and chemicals. This stretches the walls of the colon, causing abdominal bloating, distension, cramping, pain, and/or changes in bowel habits in many patients with IBS. FODMAPs are not unhealthy or harmful, but may exacerbate GI symptoms in those with sensitive GI tracts.  Follow up 3-4 months.  Thank you for entrusting me with your care and for choosing [facility], Dr. Inocente Hausen  _______________________________________________________  If your blood pressure at your visit was 140/90 or greater, please contact your primary care physician to follow up on this.  _______________________________________________________  If you are age 53 or older, your body mass index should be between 23-30. Your Body mass index is 18.28 kg/m. If this is out of the aforementioned range listed, please consider follow up with your Primary Care Provider.  If you are age 11 or younger, your body mass index should be between 19-25. Your Body mass index is 18.28 kg/m. If this is out of the aformentioned range listed, please consider follow up with your Primary Care Provider.   ________________________________________________________  The [facility] providers would like to encourage you to use MYCHART to communicate with providers for non-urgent requests or questions.  Due to long hold times on the telephone, sending your provider a message by [facility] may be a faster and more efficient way to get a response.  Please allow 48 business hours for a response.  Please remember that this is for non-urgent requests.  _______________________________________________________  [facility] is using a team-based approach  to care.  Your team is made up of your doctor and two to three APPS. Our APPS (Nurse Practitioners and Physician Assistants) work with your physician to ensure care continuity for you. They are  fully qualified to address your health concerns and develop a treatment plan. They communicate directly with your gastroenterologist to care for you. Seeing the Advanced Practice Practitioners on your physician's team can help you by facilitating care more promptly, often allowing for earlier appointments, access to diagnostic testing, procedures, and other specialty referrals.

## 2023-10-05 LAB — TISSUE TRANSGLUTAMINASE ABS,IGG,IGA
(tTG) Ab, IgA: <1 U/mL
(tTG) Ab, IgG: <1 U/mL

## 2023-10-05 LAB — IGA: Immunoglobulin A: 123 mg/dL (ref 47–310)

## 2023-10-07 ENCOUNTER — Ambulatory Visit: Payer: Self-pay | Admitting: Pediatrics

## 2023-10-08 ENCOUNTER — Encounter

## 2023-10-08 DIAGNOSIS — Z1231 Encounter for screening mammogram for malignant neoplasm of breast: Secondary | ICD-10-CM

## 2023-10-10 ENCOUNTER — Encounter: Admitting: Physical Therapy

## 2023-10-14 ENCOUNTER — Ambulatory Visit: Admitting: Physical Therapy

## 2023-10-14 ENCOUNTER — Encounter: Payer: Self-pay | Admitting: Physical Therapy

## 2023-10-14 DIAGNOSIS — M6281 Muscle weakness (generalized): Secondary | ICD-10-CM | POA: Diagnosis not present

## 2023-10-14 DIAGNOSIS — R278 Other lack of coordination: Secondary | ICD-10-CM

## 2023-10-14 NOTE — Therapy (Signed)
 OUTPATIENT PHYSICAL THERAPY FEMALE PELVIC TREATMENT   Patient Name: Dana Joseph MRN: [id number] DOB:[birth year], 53 y.o., female Today's Date: 10/14/2023  END OF SESSION:  PT End of Session - 10/14/23 1128     Visit Number 6    Date for PT Re-Evaluation 01/31/24    Authorization Type [id number]  NO AUTHOR REQ    PT Start Time 1100    PT Stop Time 1145    PT Time Calculation (min) 45 min    Activity Tolerance Patient tolerated treatment well    Behavior During Therapy WFL for tasks assessed/performed            Session number 3 on 08/12/23  Past Medical History:  Diagnosis Date   Allergic rhinitis    Anemia    Colon polyps    Complication of anesthesia 2016   trouble swallowing after 06-2014 total thyroidectomy for a few days, issue resolved   Dysrhythmia    heart palpitations   Headache    Hypothyroidism    PONV (postoperative nausea and vomiting)    will wear scopolamine  patch but wants to take off after a few hours   Thyroid     06-2014   Wears glasses 11/09/2020   reading only   Past Surgical History:  Procedure Laterality Date   BRAVO [facility] STUDY N/A 07/29/2013   Procedure: BRAVO PH STUDY;  Surgeon: Elsie Cree, MD;  Location: [facility];  Service: Endoscopy;  Laterality: N/A;   BREAST BIOPSY Right 10/04/2022   MM RT BREAST BX W LOC DEV 1ST LESION IMAGE BX SPEC STEREO GUIDE 10/04/2022 [facility]   CESAREAN SECTION  2003   CHOLECYSTECTOMY  2010   laparoscopic   COLONOSCOPY  2020   dr daria beavers labauer   DEEP NECK LYMPH NODE BIOPSY / EXCISION  1998   DILATATION & CURETTAGE/HYSTEROSCOPY WITH MYOSURE N/A 03/21/2017   Procedure: DILATATION & CURETTAGE/HYSTEROSCOPY WITH MYOSURE;  Surgeon: Kandyce Burnard, MD;  Location: [facility];  Service: Gynecology;  Laterality: N/A;   DILATION AND CURETTAGE OF UTERUS     ESOPHAGEAL MANOMETRY N/A 07/13/2013   Procedure: ESOPHAGEAL MANOMETRY (EM);  Surgeon: Elsie Cree, MD;  [facility]: [facility];  Service:  Endoscopy;  Laterality: N/A;  Patient will arrive @ 1015 to pre op for procedure until time for mano.    ESOPHAGOGASTRODUODENOSCOPY (EGD) WITH PROPOFOL  N/A 07/29/2013   Procedure: ESOPHAGOGASTRODUODENOSCOPY (EGD) WITH PROPOFOL ;  Surgeon: Elsie Cree, MD;  Location: [facility];  Service: Endoscopy;  Laterality: N/A;   LYMPH NODE BIOPSY  1998   ROBOTIC ASSISTED LAPAROSCOPIC HYSTERECTOMY AND SALPINGECTOMY Bilateral 11/17/2020   Procedure: XI ROBOTIC ASSISTED LAPAROSCOPIC HYSTERECTOMY AND SALPINGECTOMY;  Surgeon: Kandyce Burnard, MD;  Location: [facility];  Service: Gynecology;  Laterality: Bilateral;  Requests 3hrs. Tracie to RNFA   THYROIDECTOMY  06/2014   total   WISDOM TOOTH EXTRACTION  1990   Patient Active Problem List   Diagnosis Date Noted   Uterine fibroid 11/17/2020   S/P hysterectomy 11/17/2020   Vaginal leukorrhea 01/21/2008   Goiter 12/08/2007   Hypothyroidism 12/08/2007   PALPITATIONS 12/08/2007    PCP: Rena Luke POUR, MD  REFERRING PROVIDER: Kandyce Burnard, MD  REFERRING DIAG: N81.6 (ICD-10-CM) - Rectocele  THERAPY DIAG:  Muscle weakness (generalized)  Other lack of coordination  Rationale for Evaluation and Treatment: Rehabilitation  ONSET DATE: 02/2023  SUBJECTIVE:  SUBJECTIVE STATEMENT: Patient reports that she she feels fine. She feels like her abs are getting stronger Did SIBO test last week waiting on results Urine leakage is definitely better as well as pelvic pressure Feels good first thing in the morning      From eval:  Pt reports that she has had combination of digestive issues, hard time going to the bathroom and not completely emptying, Since beginning of the year She saw stage 2 rectocele, she suggested pelvic PT.  Can tell something is  different, pressure, bulging, dryness.  Hysterectomy 2022, lots of fibroids, glad she did it. Still has her ovaries.  Likes to do sit ups but stopped recently because she was not sure if she is making the prolapse worse  Fluid intake: mostly water, tea and coffee  PAIN:  Are you having pain? No   PRECAUTIONS: None  RED FLAGS: None   WEIGHT BEARING RESTRICTIONS: No  FALLS:  Has patient fallen in last 6 months? No  OCCUPATION: desk job  ACTIVITY LEVEL : walks every day  PLOF: Independent  PATIENT GOALS: get rid of the problem, leaking and problem- feeling like there is bulge and discomfort  PERTINENT HISTORY:  hysterectomy Sexual abuse: No  BOWEL MOVEMENT: Pain with bowel movement: No Type of bowel movement:Type (Bristol Stool Scale) 3-4- most of her time tends to be constipated, eats a lot of fiber Fully empty rectum: No- has to go several times/ day Leakage: Yes: at times- feels like she passed gas, it is not gas Pads: No Fiber supplement/laxative Yes - benefiber  URINATION: Pain with urination: No Fully empty bladder: Yes:   Stream: Strong Urgency: No Frequency: no Leakage: Coughing, Sneezing, and Exercise Pads: No  INTERCOURSE:  Ability to have vaginal penetration Yes  Pain with intercourse: none DrynessYes  Climax: no- not in a long time Marinoff Scale: 0/3 Laxative:no  PREGNANCY: Vaginal deliveries 1 Tearing Yes:   Episiotomy No C-section deliveries 1 Currently pregnant No  PROLAPSE: Pressure and Bulge   OBJECTIVE:  Note: Objective measures were completed at Evaluation unless otherwise noted.  DIAGNOSTIC FINDINGS:  None recently  PATIENT SURVEYS:    PFIQ-7: 38  COGNITION: Overall cognitive status: Within functional limits for tasks assessed     SENSATION: Light touch: Appears intact  LUMBAR SPECIAL TESTS:  Straight leg raise test: Positive for compensation of abdomen Single leg squat- bilateral knee valgus   POSTURE:  rounded shoulders and forward head   LUMBARAROM/PROM:  A/PROM A/PROM % of availability  Flexion 75  Extension   Right lateral flexion 75  Left lateral flexion   Right rotation 75  Left rotation 70   (Blank rows = not tested)  LOWER EXTREMITY ROM:  full, some tightness bilateral hamstrings with trunk flexion LOWER EXTREMITY MMT:4/5 overall  PALPATION:   General: large abdominal diastasis 1-3 fingers at and above umbilicus  Pelvic Alignment: even  Abdominal: large DRA                External Perineal Exam: mild dryness present, some decreased clitoral hood mobility                             Internal Pelvic Floor: weakness present, good tone rectally, difficulty with lengthening vaginally and rectally  Patient confirms identification and approves PT to assess internal pelvic floor and treatment Yes  PELVIC MMT:   MMT eval  Vaginal 1/5  Internal Anal Sphincter 4/5  External Anal  Sphincter 4/5  Puborectalis 4/5  Diastasis Recti 1-3 finger diastasis present  (Blank rows = not tested)        TONE: average  PROLAPSE: Mild Posterior vaginal wall laxity present in hook lying  TODAY'S TREATMENT:  10/14/2023 Palloff press- 20 reps bilat Shoulder extension with cable with transverse abdominis breath 20 reps bilat Education on food diary Supine chops 20 reps #10 with transverse abdominis breath  Chest press 20 reps #10 with transverse abdominis breath  Bridging with march 20 reps Bridging with transverse abdominis breath 20 reps with green theraband  Child's pose 1 min Bird dog with transverse abdominis breath 15 reps bilateral Open books with green theraband 10 reps    10/01/2023 Manual therapy- abdominal massage for constipation and DRA, scar massage abdomen Neuro reed- chest press 20 reps Palloff press pulleys 1 plate 20 reps bilat Shoulder extensions 20 reps 1 plate Shoulder adduction 20 reps 1 plate  Shrink wrap lower abdomen 10 reps                                                                                                                               08/01/2023 EVAL see below  Neuro reed- ball press with transverse abdominis breath  PATIENT EDUCATION/ there acts:   Education details: Pt was educated on relevant anatomy, exam findings, HEP, expectations of PT, squatty potty for constipation   Person educated: Patient Education method: Explanation, Demonstration, Tactile cues, Verbal cues, and Handouts Education comprehension: verbalized understanding, returned demonstration, verbal cues required, tactile cues required, and needs further education  HOME EXERCISE PROGRAM: [id number]  ASSESSMENT:  CLINICAL IMPRESSION: Patient was seen today for treatment of urinary incontinence, constipation and DRA. Patient with improving engagement of abdominal muscles and coordination with breath and pelvic floor. Patient did well with exercises and education today. We discussed progress, HEP and recommended keeping food diary to identify irritants. Treatment session focused on exercises to improve core strength and coordination with breath. Patient had some difficulty with deeper exhale. Patient is progressing well towards goals and will benefit from continued PT to address deficits, reduce constipation and bloating  and improve quality of life.    Will continue to benefit from PT to address deficits.     OBJECTIVE IMPAIRMENTS: decreased coordination, decreased knowledge of condition, decreased ROM, decreased strength, and improper body mechanics.   ACTIVITY LIMITATIONS: continence and toileting  PARTICIPATION LIMITATIONS: interpersonal relationship and community activity  PERSONAL FACTORS: Behavior pattern and Time since onset of injury/illness/exacerbation are also affecting patient's functional outcome.   REHAB POTENTIAL: Good  CLINICAL DECISION MAKING: Evolving/moderate complexity  EVALUATION COMPLEXITY: Moderate   GOALS: Goals  reviewed with patient? Yes  SHORT TERM GOALS: Target date: 08/29/2023    Pt will be independent with HEP.   Baseline: Goal status: met  2.  Pt will be independent with use of squatty potty, relaxed toileting mechanics, and improved bowel movement techniques in order to increase ease of bowel  movements and complete evacuation.   Baseline:  Goal status: met  3.  Pt will be educated on femmeze to help reduce rectocele Baseline:  Goal status: met  4.  Pt will be I with abdominal massage to help improve constipation Baseline:  Goal status: met    LONG TERM GOALS: Target date: 01/31/2024  Pt will be independent with advanced HEP.   Baseline:  Goal status: INITIAL  2.  Pt will report 7 complete BMs per week due to improved muscle tone and coordination with BMs.  Baseline:  Goal status: INITIAL  3.  Pt will have 0 fecal and urinary incontinence/ week and be able to participate in community activities without embarrassment Baseline:  Goal status: INITIAL  4.  Pt will demonstrate good pelvic floor lengthening in order to urinate and defecate with improved ease Baseline:  Goal status: INITIAL  5.  Pt will dem improved pressure management strategies  in order not to put increased pressure on abdominal separation and her pelvic floor and worsen prolapse Baseline:  Goal status: progressing 10/14/23  PLAN:  PT FREQUENCY: 1-2x/week  PT DURATION: 6 months  PLANNED INTERVENTIONS: 97110-Therapeutic exercises, 97530- Therapeutic activity, 97112- Neuromuscular re-education, 97535- Self Care, [id number]- Manual therapy, [phone]- Aquatic Therapy, [phone]- Electrical stimulation (manual), Patient/Family education, Balance training, Taping, Joint mobilization, Joint manipulation, Spinal manipulation, Spinal mobilization, Scar mobilization, Cryotherapy, Moist heat, and Biofeedback  PLAN FOR NEXT SESSION: continue strengthening for pelvic floor and focus on diastasis, abdominal massage, food and bowel  diary, schedule an internal   Prestina Raigoza, PT 10/14/2023, 11:29 AM

## 2023-10-15 ENCOUNTER — Encounter: Payer: Self-pay | Admitting: Pediatrics

## 2023-10-15 MED ORDER — RIFAXIMIN 550 MG PO TABS: 550.0000 mg | ORAL_TABLET | Freq: Three times a day (TID) | ORAL | 0 refills | Status: DC

## 2023-10-29 MED ORDER — RIFAXIMIN 550 MG PO TABS: 550.0000 mg | ORAL_TABLET | Freq: Three times a day (TID) | ORAL | 0 refills | Status: AC

## 2023-10-29 NOTE — Addendum Note (Signed)
 Addended by: CRAIG PALMA on: 10/29/2023 11:33 AM   Modules accepted: Orders

## 2023-11-11 ENCOUNTER — Encounter: Payer: Self-pay | Admitting: Pediatrics

## 2024-01-16 ENCOUNTER — Ambulatory Visit: Admitting: Pediatrics

## 2024-01-26 NOTE — Progress Notes (Unsigned)
 [facility] Initial Consultation   Referring Provider Rena Luke POUR, MD [address] [place],  [place] [address]  Primary Care Provider Briscoe, Luke POUR, MD  Patient Profile: Dana Joseph is a 53 y.o. female who returns to the [facility] office for follow-up of the problem(s) noted below.  Problem List: Chronic constipation Bloating Reported history of adenomatous colon polyps Family history of colon cancer-maternal grandmother History of pill dysphagia History of esophageal spasms Status post cholecystectomy for biliary dyskinesia   History of Present Illness    Discussed the use of AI scribe software for clinical note transcription with the patient, who gave verbal consent to proceed.  History of Present Illness Dana Joseph is a 53 year old female with a history of hypothyroidism and goiter who returns to the gastroenterology office for follow-up of abdominal bloating, gas and constipation  Current GI Meds    Interval History        Abdominal bloating and gas - Increased abdominal bloating since the beginning of the year - Occasional use of Beano for gas management with high-fiber foods - Even when she does not feel gassy notes that her abdomen appears distended or protuberant - No history of kidney or liver issues  Altered bowel habits, constipation - Previously experienced increased frequency of bowel movements, occurring multiple times daily - Described a sensation of potential fecal incontinence despite increased frequency - Reports that frequency and incontinence issues have improved since adjustment of thyroid  medication - Stools remain constipated with harder consistency - No diarrhea - Family history of constipation and lifelong tendency toward constipation - Temporary relief with Miralax, psyllium, flaxseed, and Citrucel - Long-term use of probiotics with no improvement after trying different brands over the  past 8-9 months - Familiar with the low FODMAP diet but has not tried it - Not aware of previous celiac testing - Will be seeing GYN in the next 3 weeks for further assessment and advised discussing bloating issues to ensure it does not represent a gynecologic pathology  Pelvic floor dysfunction - History of stage two rectocele - Strains to defecate once every 2 weeks but not on a regular basis - Completed pelvic floor therapy, which improved bladder control but not a substantial change in bowel habits or constipation  Dysphagia - Does not feel this has impacted her quality of life - Occasional difficulty swallowing pills  Colorectal cancer screening - Chart documents possible prior history of colon polyps -?  Adenomatous - As below, last colonoscopy in 2020 was normal-recall due 05/15/2028   Last colonoscopy: 04/2018 - normal, redundant and tortuous colon Last endoscopy: 07/2013 -normal EGD, Bravo capsule placed  Last Abd CT/CTE/MRE: None  GI Review of Symptoms Significant for constipation and bloating. Otherwise negative.  General Review of Systems  Review of systems is significant for the pertinent positives and negatives as listed per the HPI.  Full ROS is otherwise negative.  Past Medical History   Past Medical History:  Diagnosis Date   Allergic rhinitis    Anemia    Colon polyps    Complication of anesthesia 2016   trouble swallowing after 06-2014 total thyroidectomy for a few days, issue resolved   Dysrhythmia    heart palpitations   Headache    Hypothyroidism    PONV (postoperative nausea and vomiting)    will wear scopolamine  patch but wants to take off after a few hours   Thyroid     06-2014   Wears glasses 11/09/2020   reading only  Past Surgical History   Past Surgical History:  Procedure Laterality Date   BRAVO PH STUDY N/A 07/29/2013   Procedure: BRAVO PH STUDY;  Surgeon: Elsie Cree, MD;  [facility];  Service: Endoscopy;   Laterality: N/A;   BREAST BIOPSY Right 10/04/2022   MM RT BREAST BX W LOC DEV 1ST LESION IMAGE BX SPEC STEREO GUIDE 10/04/2022 [facility]   CESAREAN SECTION  2003   CHOLECYSTECTOMY  2010   laparoscopic   COLONOSCOPY  2020   dr daria [facility]avers labauer   DEEP NECK LYMPH NODE BIOPSY / EXCISION  1998   DILATATION & CURETTAGE/HYSTEROSCOPY WITH MYOSURE N/A 03/21/2017   Procedure: DILATATION & CURETTAGE/HYSTEROSCOPY WITH MYOSURE;  Surgeon: Kandyce Sor, MD;  Location: [facility];  Service: Gynecology;  Laterality: N/A;   DILATION AND CURETTAGE OF UTERUS     ESOPHAGEAL MANOMETRY N/A 07/13/2013   Procedure: ESOPHAGEAL MANOMETRY (EM);  Surgeon: Elsie Cree, MD;  [facility]: [facility];  Service: Endoscopy;  Laterality: N/A;  Patient will arrive @ 1015 to pre op for procedure until time for mano.    ESOPHAGOGASTRODUODENOSCOPY (EGD) WITH PROPOFOL  N/A 07/29/2013   Procedure: ESOPHAGOGASTRODUODENOSCOPY (EGD) WITH PROPOFOL ;  Surgeon: Elsie Cree, MD;  [facility];  Service: Endoscopy;  Laterality: N/A;   LYMPH NODE BIOPSY  1998   ROBOTIC ASSISTED LAPAROSCOPIC HYSTERECTOMY AND SALPINGECTOMY Bilateral 11/17/2020   Procedure: XI ROBOTIC ASSISTED LAPAROSCOPIC HYSTERECTOMY AND SALPINGECTOMY;  Surgeon: Kandyce Sor, MD;  Location: [facility];  Service: Gynecology;  Laterality: Bilateral;  Requests 3hrs. Tracie to RNFA   THYROIDECTOMY  06/2014   total   WISDOM TOOTH EXTRACTION  1990     Allergies and Medications   Allergies  Allergen Reactions   Aspirin Itching    mouth was itching    No outpatient medications have been marked as taking for the 01/28/24 encounter (Appointment) with Naelani Lafrance, Inocente HERO, MD.    Family History   Family History  Problem Relation Age of Onset   Hypertension Mother    Heart disease Father    Breast cancer Maternal Grandmother    Colon cancer Maternal Grandmother    Esophageal cancer Neg Hx      Social History   Social History    Tobacco Use   Smoking status: Never   Smokeless tobacco: Never  Vaping Use   Vaping status: Never Used  Substance Use Topics   Alcohol use: Yes    Comment: rare   Drug use: Never   Dana Joseph reports that she has never smoked. She has never used smokeless tobacco. She reports current alcohol use. She reports that she does not use drugs.  Vital Signs and Physical Examination   There were no vitals filed for this visit.  There is no height or weight on file to calculate BMI.    General: Well developed, well nourished, no acute distress Head: Normocephalic and atraumatic Eyes: Sclerae anicteric, EOMI Lungs: Clear throughout to auscultation Heart: Regular rate and rhythm; No murmurs, rubs or bruits Abdomen: Soft, non tender and non distended. No masses, hepatosplenomegaly or hernias noted. Normal Bowel sounds Rectal: Deferred Musculoskeletal: Symmetrical with no gross deformities    Review of Data  The following data was reviewed at the time of this encounter:  Laboratory Studies      Latest Ref Rng & Units 11/18/2020    2:15 AM 11/15/2020   12:01 PM 03/21/2017   11:30 AM  CBC  WBC 4.0 - 10.5 K/uL 11.6  6.5  5.7  Hemoglobin 12.0 - 15.0 g/dL 86.8  85.8  87.3   Hematocrit 36.0 - 46.0 % 38.3  42.7  37.7   Platelets 150 - 400 K/uL 181  213  227     No results found for: LIPASE    Latest Ref Rng & Units 11/15/2020   12:01 PM 12/16/2007    8:22 AM 11/29/2007   10:10 PM  CMP  Glucose 70 - 99 mg/dL 95  82  98   BUN 6 - 20 mg/dL 27  12  12    Creatinine 0.44 - 1.00 mg/dL 9.30  0.6  0.7   Sodium 135 - 145 mmol/L 140  138  140   Potassium 3.5 - 5.1 mmol/L 4.7  4.1  3.6   Chloride 98 - 111 mmol/L 107  104  103   CO2 22 - 32 mmol/L 27  30  29    Calcium 8.9 - 10.3 mg/dL 9.4  9.0  9.3   Total Protein 6.0 - 8.3 g/dL  7.2    Total Bilirubin 0.3 - 1.2 mg/dL  1.0    Alkaline Phos 39 - 117 units/L  32    AST 0 - 37 units/L  16    ALT 0 - 35 units/L  17       Imaging Studies   Barium esophagram 06/2013 1. Moderate gastroesophageal reflux.  2. Slight delay in passage of the barium pill into the stomach, but  the pill did pass intact.   GI Procedures and Studies  Colonoscopy 04/2018 Normal, redundant and tortuous colon  EGD 07/2013 Normal, Bravo capsule placed  Clinical Impression  It is my clinical impression that Dana Joseph is a 53 y.o. 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  She feels that her bloating has worsened since the first of the year.  Notes that symptoms of constipation have improved with adjustment in her thyroid  medication but continues to have hard stools.  We discussed that the differential diagnosis for her symptoms of bloating could include: Sequelae of constipation, SIBO, celiac disease, nonceliac gluten sensitivity, form of carbohydrate intolerance, gynecologic etiology.   I recommended a trial of low-dose laxative such as MiraLAX on a daily basis to see if this further ameliorates her constipation.  She has a rectocele and has participated in pelvic floor physical therapy although she is not sure that it is necessarily impacted her bowel function.  She is amenable to proceeding with testing for celiac disease and SIBO today.  Reviewed dietary modification with low FODMAP diet.  If this is ineffective can also consider gluten-free diet even if she does not have celiac disease for possible nonceliac gluten sensitivity.  Also reviewed that some forms of gynecologic pathology such as ovarian issues can present as bloating.  She has a follow-up exam with her gynecologist in 3 weeks.  Plan  Labs today: Celiac panel Order placed for  SIBO breath testing Handout provided for low FODMAP diet If celiac disease is ruled out and no benefit from low FODMAP diet can consider trial of gluten-free diet for possible known celiac gluten sensitivity Suggest daily low-dose laxative such as MiraLAX 1/4-1/2 cap daily to regulate bowel function May continue to use probiotics, Beano as needed Next screening colonoscopy due 04/2028 -if symptoms of  constipation are not improving with conservative measures or alarm features evolve such as rectal bleeding or weight loss would consider a sooner colonoscopy.  Planned Follow Up 3-4 months  The patient or caregiver verbalized understanding of the material covered, with no barriers to understanding. All questions were answered. Patient or caregiver is agreeable with the plan outlined above.    It was a pleasure to see Dana Joseph.  If you have any questions or concerns regarding this evaluation, do not hesitate to contact me.  Inocente Hausen, MD [facility]

## 2024-01-28 ENCOUNTER — Telehealth: Payer: Self-pay | Admitting: Pediatrics

## 2024-01-28 ENCOUNTER — Ambulatory Visit: Admitting: Pediatrics

## 2024-01-28 ENCOUNTER — Encounter: Payer: Self-pay | Admitting: Pediatrics

## 2024-01-28 VITALS — BP 102/68 | HR 69 | Ht 66.0 in | Wt 117.4 lb

## 2024-01-28 DIAGNOSIS — M6289 Other specified disorders of muscle: Secondary | ICD-10-CM | POA: Diagnosis not present

## 2024-01-28 DIAGNOSIS — K5900 Constipation, unspecified: Secondary | ICD-10-CM

## 2024-01-28 DIAGNOSIS — R140 Abdominal distension (gaseous): Secondary | ICD-10-CM | POA: Diagnosis not present

## 2024-01-28 DIAGNOSIS — K638219 Small intestinal bacterial overgrowth, unspecified: Secondary | ICD-10-CM | POA: Diagnosis not present

## 2024-01-28 NOTE — Patient Instructions (Signed)
 You have been scheduled for a CT scan of the abdomen and pelvis at [facility], 1st floor Radiology. You are scheduled on Thursday, 01/30/24 at 4:30 pm. You should arrive 2 hours and 15 minutes prior to your appointment time for registration and to drink the oral contrast prior to your procedure.    Please follow the written instructions below on the day of your exam:   1) Do not eat anything after 12:30 (4 hours prior to your test)    You may take any medications as prescribed with a small amount of water, if necessary. If you take any of the following medications: METFORMIN, GLUCOPHAGE, GLUCOVANCE, AVANDAMET, RIOMET, FORTAMET, ACTOPLUS MET, JANUMET, GLUMETZA or METAGLIP, you MAY be asked to HOLD this medication 48 hours AFTER the exam.   The purpose of you drinking the oral contrast is to aid in the visualization of your intestinal tract. The contrast solution may cause some diarrhea. Depending on your individual set of symptoms, you may also receive an intravenous injection of x-ray contrast/dye. Plan on being at [facility] for 45 minutes or longer, depending on the type of exam you are having performed.   If you have any questions regarding your exam or if you need to reschedule, you may call [facility] at [phone] between the hours of 8:00 am and 5:00 pm, Monday-Friday.   Thank you for entrusting me with your care and for choosing [facility], Dr. Inocente Hausen  _______________________________________________________  If your blood pressure at your visit was 140/90 or greater, please contact your primary care physician to follow up on this.  _______________________________________________________  If you are age 53 or older, your body mass index should be between 23-30. Your Body mass index is 18.94 kg/m. If this is out of the aforementioned range listed, please consider follow up with your Primary Care Provider.  If you are age 53 or younger, your body  mass index should be between 19-25. Your Body mass index is 18.94 kg/m. If this is out of the aformentioned range listed, please consider follow up with your Primary Care Provider.   ________________________________________________________  The [facility] providers would like to encourage you to use MYCHART to communicate with providers for non-urgent requests or questions.  Due to long hold times on the telephone, sending your provider a message by [facility] may be a faster and more efficient way to get a response.  Please allow 48 business hours for a response.  Please remember that this is for non-urgent requests.  _______________________________________________________  [facility] is using a team-based approach to care.  Your team is made up of your doctor and two to three APPS. Our APPS (Nurse Practitioners and Physician Assistants) work with your physician to ensure care continuity for you. They are fully qualified to address your health concerns and develop a treatment plan. They communicate directly with your gastroenterologist to care for you. Seeing the Advanced Practice Practitioners on your physician's team can help you by facilitating care more promptly, often allowing for earlier appointments, access to diagnostic testing, procedures, and other specialty referrals.

## 2024-01-28 NOTE — Telephone Encounter (Signed)
 Inbound call from [facility] in regards to patient wanting to have a ct scan done at [facility] instead.   Fax number [phone]  Please advise. Thank you

## 2024-01-29 ENCOUNTER — Encounter: Payer: Self-pay | Admitting: Pediatrics

## 2024-01-29 NOTE — Telephone Encounter (Signed)
 CT scan order faxed and patient notified

## 2024-01-29 NOTE — Telephone Encounter (Signed)
 See note from 12/2.  CT scan rx was faxed to [facility] and patient was advised.

## 2024-01-30 ENCOUNTER — Ambulatory Visit (HOSPITAL_COMMUNITY)
